# Patient Record
Sex: Female | Born: 1945 | Hispanic: Yes | Smoking: Former smoker
Health system: Southern US, Community
[De-identification: ages and names within clinical notes are randomized; demographics above are authoritative.]

## PROBLEM LIST (undated history)

## (undated) DIAGNOSIS — E049 Nontoxic goiter, unspecified: Secondary | ICD-10-CM

## (undated) DIAGNOSIS — J40 Bronchitis, not specified as acute or chronic: Secondary | ICD-10-CM

## (undated) DIAGNOSIS — I499 Cardiac arrhythmia, unspecified: Secondary | ICD-10-CM

## (undated) DIAGNOSIS — N6019 Diffuse cystic mastopathy of unspecified breast: Secondary | ICD-10-CM

## (undated) DIAGNOSIS — I10 Essential (primary) hypertension: Secondary | ICD-10-CM

## (undated) DIAGNOSIS — E785 Hyperlipidemia, unspecified: Secondary | ICD-10-CM

## (undated) DIAGNOSIS — E559 Vitamin D deficiency, unspecified: Secondary | ICD-10-CM

## (undated) DIAGNOSIS — E039 Hypothyroidism, unspecified: Secondary | ICD-10-CM

## (undated) DIAGNOSIS — L509 Urticaria, unspecified: Secondary | ICD-10-CM

## (undated) HISTORY — PX: THYROIDECTOMY: SHX17

## (undated) HISTORY — PX: FRACTURE SURGERY: SHX138

## (undated) HISTORY — PX: APPENDECTOMY: SHX54

## (undated) HISTORY — PX: ABDOMINAL HYSTERECTOMY: SHX81

## (undated) HISTORY — PX: COLONOSCOPY: SHX174

## (undated) HISTORY — PX: BREAST CYST ASPIRATION: SHX578

---

## 2004-01-31 ENCOUNTER — Ambulatory Visit: Payer: Self-pay | Admitting: Unknown Physician Specialty

## 2004-02-06 ENCOUNTER — Ambulatory Visit: Payer: Self-pay

## 2005-02-28 ENCOUNTER — Ambulatory Visit: Payer: Self-pay | Admitting: Unknown Physician Specialty

## 2006-04-16 ENCOUNTER — Ambulatory Visit: Payer: Self-pay | Admitting: Unknown Physician Specialty

## 2007-04-30 ENCOUNTER — Ambulatory Visit: Payer: Self-pay | Admitting: Unknown Physician Specialty

## 2008-05-03 ENCOUNTER — Ambulatory Visit: Payer: Self-pay | Admitting: Unknown Physician Specialty

## 2009-07-25 ENCOUNTER — Ambulatory Visit: Payer: Self-pay | Admitting: Unknown Physician Specialty

## 2010-05-29 ENCOUNTER — Ambulatory Visit: Payer: Self-pay | Admitting: Allergy and Immunology

## 2010-06-20 ENCOUNTER — Emergency Department: Payer: Self-pay | Admitting: Emergency Medicine

## 2010-07-04 ENCOUNTER — Ambulatory Visit: Payer: Self-pay | Admitting: Unknown Physician Specialty

## 2010-07-30 ENCOUNTER — Ambulatory Visit: Payer: Self-pay | Admitting: Unknown Physician Specialty

## 2011-11-12 ENCOUNTER — Ambulatory Visit: Payer: Self-pay | Admitting: Unknown Physician Specialty

## 2013-01-06 ENCOUNTER — Ambulatory Visit: Payer: Self-pay | Admitting: Internal Medicine

## 2014-01-31 ENCOUNTER — Other Ambulatory Visit: Payer: Self-pay | Admitting: Gastroenterology

## 2014-01-31 ENCOUNTER — Emergency Department: Payer: Self-pay | Admitting: Emergency Medicine

## 2014-01-31 LAB — COMPREHENSIVE METABOLIC PANEL
ALT: 26 U/L
AST: 21 U/L (ref 15–37)
Albumin: 3.5 g/dL (ref 3.4–5.0)
Alkaline Phosphatase: 77 U/L
Anion Gap: 6 — ABNORMAL LOW (ref 7–16)
BUN: 8 mg/dL (ref 7–18)
Bilirubin,Total: 0.4 mg/dL (ref 0.2–1.0)
Calcium, Total: 8.1 mg/dL — ABNORMAL LOW (ref 8.5–10.1)
Chloride: 113 mmol/L — ABNORMAL HIGH (ref 98–107)
Co2: 29 mmol/L (ref 21–32)
Creatinine: 0.81 mg/dL (ref 0.60–1.30)
EGFR (Non-African Amer.): >60
Glucose: 104 mg/dL — ABNORMAL HIGH (ref 65–99)
Osmolality: 293 (ref 275–301)
Potassium: 3.6 mmol/L (ref 3.5–5.1)
Sodium: 148 mmol/L — ABNORMAL HIGH (ref 136–145)
Total Protein: 7.3 g/dL (ref 6.4–8.2)

## 2014-01-31 LAB — CBC
HCT: 46.3 % (ref 35.0–47.0)
HGB: 15.2 g/dL (ref 12.0–16.0)
MCH: 30.5 pg (ref 26.0–34.0)
MCHC: 32.8 g/dL (ref 32.0–36.0)
MCV: 93 fL (ref 80–100)
PLATELETS: 241 10*3/uL (ref 150–440)
RBC: 4.99 10*6/uL (ref 3.80–5.20)
RDW: 12.2 % (ref 11.5–14.5)
WBC: 4.3 10*3/uL (ref 3.6–11.0)

## 2014-01-31 LAB — URINALYSIS, COMPLETE
BILIRUBIN, UR: NEGATIVE
Bacteria: NONE SEEN
Blood: NEGATIVE
GLUCOSE, UR: NEGATIVE mg/dL (ref 0–75)
KETONE: NEGATIVE
Leukocyte Esterase: NEGATIVE
Nitrite: NEGATIVE
PH: 7 (ref 4.5–8.0)
Protein: NEGATIVE
SQUAMOUS EPITHELIAL: NONE SEEN
Specific Gravity: 1.003 (ref 1.003–1.030)
WBC UR: <1 /HPF (ref 0–5)

## 2014-01-31 LAB — TROPONIN I

## 2014-02-07 ENCOUNTER — Ambulatory Visit: Payer: Self-pay | Admitting: Internal Medicine

## 2016-07-11 ENCOUNTER — Other Ambulatory Visit: Payer: Self-pay | Admitting: Internal Medicine

## 2016-07-11 DIAGNOSIS — Z1231 Encounter for screening mammogram for malignant neoplasm of breast: Secondary | ICD-10-CM

## 2016-08-20 ENCOUNTER — Encounter: Payer: Self-pay | Admitting: Radiology

## 2016-08-20 ENCOUNTER — Ambulatory Visit
Admission: RE | Admit: 2016-08-20 | Discharge: 2016-08-20 | Disposition: A | Discharge status: Home / Self Care | Payer: Medicare Other | Source: Ambulatory Visit | Attending: Internal Medicine | Admitting: Internal Medicine

## 2016-08-20 DIAGNOSIS — Z1231 Encounter for screening mammogram for malignant neoplasm of breast: Secondary | ICD-10-CM | POA: Insufficient documentation

## 2016-10-31 ENCOUNTER — Other Ambulatory Visit: Payer: Self-pay | Admitting: Internal Medicine

## 2016-10-31 DIAGNOSIS — R042 Hemoptysis: Secondary | ICD-10-CM

## 2016-11-05 ENCOUNTER — Ambulatory Visit
Admission: RE | Admit: 2016-11-05 | Discharge: 2016-11-05 | Disposition: A | Discharge status: Home / Self Care | Payer: Medicare Other | Source: Ambulatory Visit | Attending: Internal Medicine | Admitting: Internal Medicine

## 2016-11-05 DIAGNOSIS — R042 Hemoptysis: Secondary | ICD-10-CM

## 2016-11-05 DIAGNOSIS — R05 Cough: Secondary | ICD-10-CM | POA: Diagnosis present

## 2016-11-05 DIAGNOSIS — I700 Atherosclerosis of aorta: Secondary | ICD-10-CM | POA: Insufficient documentation

## 2016-11-05 HISTORY — DX: Essential (primary) hypertension: I10

## 2016-11-05 MED ORDER — IOPAMIDOL (ISOVUE-300) INJECTION 61%
75.0000 mL | Freq: Once | INTRAVENOUS | Status: AC | PRN
Start: 2016-11-05 — End: 2016-11-05
  Administered 2016-11-05: 75 mL via INTRAVENOUS

## 2016-11-08 ENCOUNTER — Ambulatory Visit: Payer: Medicare Other

## 2016-12-19 ENCOUNTER — Other Ambulatory Visit: Payer: Self-pay | Admitting: Otolaryngology

## 2016-12-19 DIAGNOSIS — E041 Nontoxic single thyroid nodule: Secondary | ICD-10-CM

## 2016-12-24 ENCOUNTER — Ambulatory Visit
Admission: RE | Admit: 2016-12-24 | Discharge: 2016-12-24 | Disposition: A | Discharge status: Home / Self Care | Payer: Medicare Other | Source: Ambulatory Visit | Attending: Otolaryngology | Admitting: Otolaryngology

## 2016-12-24 DIAGNOSIS — E041 Nontoxic single thyroid nodule: Secondary | ICD-10-CM

## 2017-01-23 ENCOUNTER — Encounter: Payer: Self-pay | Admitting: *Deleted

## 2017-01-24 ENCOUNTER — Encounter
Admission: RE | Disposition: A | Discharge status: Home / Self Care | Payer: Self-pay | Source: Ambulatory Visit | Attending: Gastroenterology

## 2017-01-24 ENCOUNTER — Ambulatory Visit
Admission: RE | Admit: 2017-01-24 | Discharge: 2017-01-24 | Disposition: A | Discharge status: Home / Self Care | Payer: Medicare Other | Source: Ambulatory Visit | Attending: Gastroenterology | Admitting: Gastroenterology

## 2017-01-24 ENCOUNTER — Ambulatory Visit: Payer: Medicare Other | Admitting: Anesthesiology

## 2017-01-24 DIAGNOSIS — I10 Essential (primary) hypertension: Secondary | ICD-10-CM | POA: Insufficient documentation

## 2017-01-24 DIAGNOSIS — E559 Vitamin D deficiency, unspecified: Secondary | ICD-10-CM | POA: Insufficient documentation

## 2017-01-24 DIAGNOSIS — Q438 Other specified congenital malformations of intestine: Secondary | ICD-10-CM | POA: Diagnosis not present

## 2017-01-24 DIAGNOSIS — Z87891 Personal history of nicotine dependence: Secondary | ICD-10-CM | POA: Insufficient documentation

## 2017-01-24 DIAGNOSIS — Z7982 Long term (current) use of aspirin: Secondary | ICD-10-CM | POA: Insufficient documentation

## 2017-01-24 DIAGNOSIS — Z79899 Other long term (current) drug therapy: Secondary | ICD-10-CM | POA: Insufficient documentation

## 2017-01-24 DIAGNOSIS — Z1211 Encounter for screening for malignant neoplasm of colon: Secondary | ICD-10-CM | POA: Diagnosis present

## 2017-01-24 DIAGNOSIS — J449 Chronic obstructive pulmonary disease, unspecified: Secondary | ICD-10-CM | POA: Insufficient documentation

## 2017-01-24 DIAGNOSIS — E039 Hypothyroidism, unspecified: Secondary | ICD-10-CM | POA: Insufficient documentation

## 2017-01-24 DIAGNOSIS — K635 Polyp of colon: Secondary | ICD-10-CM | POA: Insufficient documentation

## 2017-01-24 HISTORY — DX: Urticaria, unspecified: L50.9

## 2017-01-24 HISTORY — DX: Bronchitis, not specified as acute or chronic: J40

## 2017-01-24 HISTORY — PX: COLONOSCOPY WITH PROPOFOL: SHX5780

## 2017-01-24 HISTORY — DX: Cardiac arrhythmia, unspecified: I49.9

## 2017-01-24 HISTORY — DX: Vitamin D deficiency, unspecified: E55.9

## 2017-01-24 HISTORY — DX: Hyperlipidemia, unspecified: E78.5

## 2017-01-24 HISTORY — DX: Hypothyroidism, unspecified: E03.9

## 2017-01-24 HISTORY — DX: Nontoxic goiter, unspecified: E04.9

## 2017-01-24 HISTORY — DX: Diffuse cystic mastopathy of unspecified breast: N60.19

## 2017-01-24 SURGERY — COLONOSCOPY WITH PROPOFOL
Anesthesia: General

## 2017-01-24 MED ORDER — SODIUM CHLORIDE 0.9 % IV SOLN
INTRAVENOUS | Status: DC
  Administered 2017-01-24: 08:00:00 via INTRAVENOUS

## 2017-01-24 MED ORDER — PROPOFOL 500 MG/50ML IV EMUL
INTRAVENOUS | Status: DC | PRN
  Administered 2017-01-24: 150 ug/kg/min via INTRAVENOUS

## 2017-01-24 MED ORDER — PHENYLEPHRINE HCL 10 MG/ML IJ SOLN
INTRAMUSCULAR | Status: DC | PRN
  Administered 2017-01-24 (×3): 100 ug via INTRAVENOUS

## 2017-01-24 MED ORDER — SODIUM CHLORIDE 0.9 % IV SOLN: INTRAVENOUS | Status: DC

## 2017-01-24 MED ORDER — PROPOFOL 10 MG/ML IV BOLUS
INTRAVENOUS | Status: DC | PRN
  Administered 2017-01-24: 20 mg via INTRAVENOUS
  Administered 2017-01-24: 50 mg via INTRAVENOUS

## 2017-01-24 NOTE — Anesthesia Post-op Follow-up Note (Signed)
Anesthesia QCDR form completed.        

## 2017-01-24 NOTE — H&P (Signed)
Outpatient short stay form Pre-procedure 01/24/2017 8:44 AM Christena DeemMartin U Ignacia Gentzler MD  Primary Physician: Dr. Leotis ShamesJasmine Singh  Reason for visit:  Screening colonoscopy  History of present illness:  Patient is a 71 year old female presenting today as above. Her last colonoscopy was in 2005 that was normal. She tolerated her prep well. She takes no aspirin with the exception of 81 mg tablet, or blood thinning agents.    Current Facility-Administered Medications:  .  0.9 %  sodium chloride infusion, , Intravenous, Continuous, Christena DeemSkulskie, Raetta Agostinelli U, MD, Last Rate: 20 mL/hr at 01/24/17 0801 .  0.9 %  sodium chloride infusion, , Intravenous, Continuous, Christena DeemSkulskie, Jesica Goheen U, MD  Medications Prior to Admission  Medication Sig Dispense Refill Last Dose  . aspirin EC 81 MG tablet Take 81 mg daily by mouth.     . Calcium Carbonate-Vitamin D (CALCIUM 600+D PO) Take 2 tablets daily by mouth.   01/23/2017 at Unknown time  . levothyroxine (SYNTHROID, LEVOTHROID) 75 MCG tablet Take 75 mcg daily before breakfast by mouth.   01/24/2017 at Unknown time  . losartan-hydrochlorothiazide (HYZAAR) 50-12.5 MG tablet Take 1 tablet daily by mouth.   01/23/2017 at Unknown time  . Vitamin D, Ergocalciferol, (DRISDOL) 50000 units CAPS capsule Take 50,000 Units every 7 (seven) days by mouth.   01/23/2017 at Unknown time  . clotrimazole-betamethasone (LOTRISONE) lotion Apply 2 (two) times daily topically.   Not Taking at Unknown time  . terbinafine (LAMISIL) 250 MG tablet Take 250 mg daily by mouth.   Not Taking at Unknown time     Allergies  Allergen Reactions  . Boniva [Ibandronic Acid]   . Crestor [Rosuvastatin]   . Evista [Raloxifene Hcl]   . Fosamax [Alendronate]   . Simvastatin   . Terbinafine And Related   . Pravastatin Rash     Past Medical History:  Diagnosis Date  . Bronchitis   . Dysrhythmia   . Fibrocystic breast disease   . Goiter   . Hyperlipidemia   . Hypertension   . Hypothyroidism   . Urticaria    . Vitamin D deficiency     Review of systems:      Physical Exam    Heart and lungs: Regular rate and rhythm without rub or gallop, lungs are bilaterally clear    HEENT: Normocephalic atraumatic eyes are anicteric    Other:     Pertinant exam for procedure: Soft nontender nondistended bowel sounds positive normoactive    Planned proceedures: Colonoscopy and indicated procedures. I have discussed the risks benefits and complications of procedures to include not limited to bleeding, infection, perforation and the risk of sedation and the patient wishes to proceed.    Christena DeemMartin U Makenleigh Crownover, MD Gastroenterology 01/24/2017  8:44 AM

## 2017-01-24 NOTE — Transfer of Care (Signed)
Immediate Anesthesia Transfer of Care Note  Patient: Melinda Hansen H Vasey  Procedure(s) Performed: COLONOSCOPY WITH PROPOFOL (N/A )  Patient Location: PACU  Anesthesia Type:General  Level of Consciousness: awake and sedated  Airway & Oxygen Therapy: Patient Spontanous Breathing and Patient connected to nasal cannula oxygen  Post-op Assessment: Report given to RN and Post -op Vital signs reviewed and stable  Post vital signs: Reviewed and stable  Last Vitals:  Vitals:   01/24/17 0804  BP: (!) 147/65  Pulse: 66  Resp: 18  Temp: (!) 35.9 C  SpO2: 100%    Last Pain:  Vitals:   01/24/17 0804  TempSrc: Tympanic         Complications: No apparent anesthesia complications

## 2017-01-24 NOTE — Anesthesia Procedure Notes (Signed)
Date/Time: 01/24/2017 9:00 AM Performed by: Junious SilkNoles, Mariely Mahr, CRNA Pre-anesthesia Checklist: Patient identified, Emergency Drugs available, Suction available, Patient being monitored and Timeout performed Oxygen Delivery Method: Nasal cannula

## 2017-01-24 NOTE — Anesthesia Preprocedure Evaluation (Signed)
Anesthesia Evaluation  Patient identified by MRN, date of birth, ID band Patient awake    Reviewed: Allergy & Precautions, H&P , NPO status , Patient's Chart, lab work & pertinent test results, reviewed documented beta blocker date and time   History of Anesthesia Complications Negative for: history of anesthetic complications  Airway Mallampati: II  TM Distance: >3 FB Neck ROM: full    Dental  (+) Dental Advidsory Given, Teeth Intact   Pulmonary neg shortness of breath, COPD, Recent URI , Residual Cough, former smoker,           Cardiovascular Exercise Tolerance: Good hypertension, (-) angina(-) CAD, (-) Past MI, (-) Cardiac Stents and (-) CABG + dysrhythmias (-) Valvular Problems/Murmurs     Neuro/Psych negative neurological ROS  negative psych ROS   GI/Hepatic Neg liver ROS, GERD  ,  Endo/Other  diabetes (Pre-diabetic)Hypothyroidism   Renal/GU negative Renal ROS  negative genitourinary   Musculoskeletal   Abdominal   Peds  Hematology negative hematology ROS (+)   Anesthesia Other Findings Past Medical History: No date: Bronchitis No date: Dysrhythmia No date: Fibrocystic breast disease No date: Goiter No date: Hyperlipidemia No date: Hypertension No date: Hypothyroidism No date: Urticaria No date: Vitamin D deficiency   Reproductive/Obstetrics negative OB ROS                             Anesthesia Physical Anesthesia Plan  ASA: III  Anesthesia Plan: General   Post-op Pain Management:    Induction: Intravenous  PONV Risk Score and Plan: 3 and Propofol infusion  Airway Management Planned: Nasal Cannula  Additional Equipment:   Intra-op Plan:   Post-operative Plan:   Informed Consent: I have reviewed the patients History and Physical, chart, labs and discussed the procedure including the risks, benefits and alternatives for the proposed anesthesia with the patient or  authorized representative who has indicated his/her understanding and acceptance.   Dental Advisory Given  Plan Discussed with: Anesthesiologist, CRNA and Surgeon  Anesthesia Plan Comments:         Anesthesia Quick Evaluation

## 2017-01-24 NOTE — Op Note (Addendum)
[facility] Gastroenterology Patient Name: Melinda RossettiDelma Hansen Procedure Date: 01/24/2017 8:38 AM MRN: [id number] Account #: [id number] Date of Birth: 05/03/1945 Admit Type: Outpatient Age: [age] [facility] Gender: Female Note Status: Finalized Procedure:            Colonoscopy Indications:          Screening for colorectal malignant neoplasm Providers:            Christena DeemMartin U. Thelton Graca, MD Referring MD:         Leotis ShamesJasmine Singh (Referring MD) Medicines:            Monitored Anesthesia Care Complications:        No immediate complications. Variability of heart rate                        and rythm including rate controlled a-fib and                        intermittant long pause were noted. Procedure:            Pre-Anesthesia Assessment:                       - ASA Grade Assessment: III - A patient with severe                        systemic disease.                       After obtaining informed consent, the colonoscope was                        passed under direct vision. Throughout the procedure,                        the patient's blood pressure, pulse, and oxygen                        saturations were monitored continuously. The                        Colonoscope was introduced through the anus and                        advanced to the the cecum, identified by appendiceal                        orifice and ileocecal valve. The colonoscopy was                        unusually difficult due to a tortuous colon. Successful                        completion of the procedure was aided by changing the                        patient to a supine position, changing the patient to a                        prone position and using manual pressure. The patient  tolerated the procedure well. The quality of the bowel                        preparation was good. Findings:      A 2 mm polyp was found in the proximal descending colon. The polyp was        sessile. The polyp was removed with a cold biopsy forceps. Resection and       retrieval were complete.      The descending colon and transverse colon were significantly redundant.      The retroflexed view of the distal rectum and anal verge was normal and       showed no anal or rectal abnormalities.      The digital rectal exam was normal. Impression:           - One 2 mm polyp in the proximal descending colon,                        removed with a cold biopsy forceps. Resected and                        retrieved.                       - Redundant colon.                       - The distal rectum and anal verge are normal on                        retroflexion view. Recommendation:       - Discharge patient to home.                       - Telephone GI clinic for pathology results in 1 week. Procedure Code(s):    --- Professional ---                       [phone], Colonoscopy, flexible; with biopsy, single or                        multiple Diagnosis Code(s):    --- Professional ---                       Z12.11, Encounter for screening for malignant neoplasm                        of colon                       D12.4, Benign neoplasm of descending colon                       Q43.8, Other specified congenital malformations of                        intestine CPT copyright 2016 American Medical Association. All rights reserved. The codes documented in this report are preliminary and upon coder review may  be revised to meet current compliance requirements. Christena Deem, MD 01/24/2017 9:32:15 AM This report has been signed electronically. Number of Addenda: 0 Note Initiated On: 01/24/2017 8:38 AM Scope Withdrawal Time: 0  hours 8 minutes 50 seconds  Total Procedure Duration: 0 hours 32 minutes 57 seconds       [facility]

## 2017-01-24 NOTE — Anesthesia Postprocedure Evaluation (Signed)
Anesthesia Post Note  Patient: Corinna GabDelma H Bosher  Procedure(s) Performed: COLONOSCOPY WITH PROPOFOL (N/A )  Patient location during evaluation: Endoscopy Anesthesia Type: General Level of consciousness: awake and alert Pain management: pain level controlled Vital Signs Assessment: post-procedure vital signs reviewed and stable Respiratory status: spontaneous breathing, nonlabored ventilation, respiratory function stable and patient connected to nasal cannula oxygen Cardiovascular status: blood pressure returned to baseline and stable Postop Assessment: no apparent nausea or vomiting Anesthetic complications: no     Last Vitals:  Vitals:   01/24/17 0950 01/24/17 1000  BP: (!) 144/76 139/63  Pulse: (!) 57 (!) 58  Resp: 15 20  Temp:    SpO2: 100% 100%    Last Pain:  Vitals:   01/24/17 0930  TempSrc: Tympanic                 Lenard SimmerAndrew Caillou Minus

## 2017-01-27 ENCOUNTER — Encounter: Payer: Self-pay | Admitting: Gastroenterology

## 2017-01-27 LAB — SURGICAL PATHOLOGY

## 2017-07-07 ENCOUNTER — Other Ambulatory Visit: Payer: Self-pay | Admitting: Internal Medicine

## 2017-07-07 DIAGNOSIS — Z1239 Encounter for other screening for malignant neoplasm of breast: Secondary | ICD-10-CM

## 2017-09-02 ENCOUNTER — Ambulatory Visit
Admission: RE | Admit: 2017-09-02 | Discharge: 2017-09-02 | Disposition: A | Discharge status: Home / Self Care | Payer: Medicare Other | Source: Ambulatory Visit | Attending: Internal Medicine | Admitting: Internal Medicine

## 2017-09-02 DIAGNOSIS — Z1231 Encounter for screening mammogram for malignant neoplasm of breast: Secondary | ICD-10-CM | POA: Insufficient documentation

## 2017-09-02 DIAGNOSIS — Z1239 Encounter for other screening for malignant neoplasm of breast: Secondary | ICD-10-CM

## 2017-09-03 ENCOUNTER — Other Ambulatory Visit: Payer: Self-pay | Admitting: Internal Medicine

## 2017-09-03 DIAGNOSIS — R921 Mammographic calcification found on diagnostic imaging of breast: Secondary | ICD-10-CM

## 2017-09-03 DIAGNOSIS — R928 Other abnormal and inconclusive findings on diagnostic imaging of breast: Secondary | ICD-10-CM

## 2017-09-12 ENCOUNTER — Ambulatory Visit
Admission: RE | Admit: 2017-09-12 | Discharge: 2017-09-12 | Disposition: A | Discharge status: Home / Self Care | Payer: Medicare Other | Source: Ambulatory Visit | Attending: Internal Medicine | Admitting: Internal Medicine

## 2017-09-12 DIAGNOSIS — R928 Other abnormal and inconclusive findings on diagnostic imaging of breast: Secondary | ICD-10-CM | POA: Insufficient documentation

## 2017-09-12 DIAGNOSIS — R921 Mammographic calcification found on diagnostic imaging of breast: Secondary | ICD-10-CM | POA: Insufficient documentation

## 2018-02-11 ENCOUNTER — Other Ambulatory Visit: Payer: Self-pay | Admitting: Physician Assistant

## 2018-02-11 ENCOUNTER — Ambulatory Visit
Admission: RE | Admit: 2018-02-11 | Discharge: 2018-02-11 | Disposition: A | Discharge status: Home / Self Care | Payer: Medicare Other | Source: Ambulatory Visit | Attending: Physician Assistant | Admitting: Physician Assistant

## 2018-02-11 DIAGNOSIS — R1032 Left lower quadrant pain: Secondary | ICD-10-CM | POA: Diagnosis not present

## 2018-02-11 MED ORDER — IOPAMIDOL (ISOVUE-300) INJECTION 61%
100.0000 mL | Freq: Once | INTRAVENOUS | Status: AC | PRN
  Administered 2018-02-11: 100 mL via INTRAVENOUS

## 2018-03-09 ENCOUNTER — Ambulatory Visit: Payer: Self-pay | Admitting: Urology

## 2018-04-03 ENCOUNTER — Encounter: Payer: Self-pay | Admitting: Urology

## 2018-04-03 ENCOUNTER — Ambulatory Visit (INDEPENDENT_AMBULATORY_CARE_PROVIDER_SITE_OTHER): Payer: Medicare Other | Admitting: Urology

## 2018-04-03 ENCOUNTER — Encounter

## 2018-04-03 DIAGNOSIS — N1330 Unspecified hydronephrosis: Secondary | ICD-10-CM | POA: Diagnosis not present

## 2018-04-03 NOTE — Progress Notes (Signed)
04/03/2018 1:47 PM   Melinda Hansen 03/03/1946 [id number]  Referring provider: Leotis ShamesSingh, Jasmine, MD [address] [facility] [place], [place] [address]  Chief Complaint  Patient presents with  . Hydronephrosis    HPI: 73 year old female seen at request of Dr. Thedore MinsSingh for evaluation of right hydronephrosis.  She was seen in late October 2019 complaining of a left lower quadrant abdominal pain and increased storage related voiding symptoms.  Urinalysis grew a low level of staph and she was treated with antibiotics.  A CT of the abdomen pelvis was performed in November 2019 which showed moderate right hydronephrosis without ureteral dilation.  She denies right-sided flank or abdominal pain.  Her left abdominal pain has resolved.  She denies previous history of urologic problems.  She has no bothersome lower urinary tract symptoms and denies gross hematuria.   PMH: Past Medical History:  Diagnosis Date  . Bronchitis   . Dysrhythmia   . Fibrocystic breast disease   . Goiter   . Hyperlipidemia   . Hypertension   . Hypothyroidism   . Urticaria   . Vitamin D deficiency     Surgical History: Past Surgical History:  Procedure Laterality Date  . ABDOMINAL HYSTERECTOMY    . APPENDECTOMY    . COLONOSCOPY    . COLONOSCOPY WITH PROPOFOL N/A 01/24/2017   Procedure: COLONOSCOPY WITH PROPOFOL;  Surgeon: Christena DeemSkulskie, Martin U, MD;  [facility];  Service: Endoscopy;  Laterality: N/A;  . FRACTURE SURGERY     left ankle  . THYROIDECTOMY      Home Medications:  Allergies as of 04/03/2018      Reactions   Boniva [ibandronic Acid]    Crestor [rosuvastatin]    Evista [raloxifene Hcl]    Fosamax [alendronate]    Simvastatin    Terbinafine And Related    Pravastatin Rash      Medication List       Accurate as of April 03, 2018  1:47 PM. Always use your most recent med list.        aspirin EC 81 MG tablet Take 81 mg daily by mouth.   CALCIUM 600+D  PO Take 2 tablets daily by mouth.   hydrochlorothiazide 12.5 MG tablet Commonly known as:  HYDRODIURIL Take 12.5 mg by mouth daily.   levothyroxine 75 MCG tablet Commonly known as:  SYNTHROID, LEVOTHROID Take 75 mcg daily before breakfast by mouth.   losartan 50 MG tablet Commonly known as:  COZAAR Take 50 mg by mouth daily.   Vitamin D (Ergocalciferol) 1.25 MG (50000 UT) Caps capsule Commonly known as:  DRISDOL Take 50,000 Units every 7 (seven) days by mouth.       Allergies:  Allergies  Allergen Reactions  . Boniva [Ibandronic Acid]   . Crestor [Rosuvastatin]   . Evista [Raloxifene Hcl]   . Fosamax [Alendronate]   . Simvastatin   . Terbinafine And Related   . Pravastatin Rash    Family History: History reviewed. No pertinent family history.  Social History:  reports that she has quit smoking. She has never used smokeless tobacco. She reports that she does not drink alcohol or use drugs.  ROS: UROLOGY Frequent Urination?: No Hard to postpone urination?: Yes Burning/pain with urination?: No Get up at night to urinate?: Yes Leakage of urine?: No Urine stream starts and stops?: Yes Trouble starting stream?: No Do you have to strain to urinate?: No Blood in urine?: No Urinary tract infection?: No Sexually transmitted disease?: No Injury  to kidneys or bladder?: No Painful intercourse?: No Weak stream?: No Currently pregnant?: No Vaginal bleeding?: No Last menstrual period?: n  Gastrointestinal Nausea?: No Vomiting?: No Indigestion/heartburn?: No Diarrhea?: No Constipation?: No  Constitutional Fever: No Night sweats?: No Weight loss?: No Fatigue?: No  Skin Skin rash/lesions?: Yes Itching?: Yes  Eyes Blurred vision?: No Double vision?: No  Ears/Nose/Throat Sore throat?: No Sinus problems?: No  Hematologic/Lymphatic Swollen glands?: No Easy bruising?: No  Cardiovascular Leg swelling?: No Chest pain?: No  Respiratory Cough?:  No Shortness of breath?: No  Endocrine Excessive thirst?: No  Musculoskeletal Back pain?: No Joint pain?: No  Neurological Headaches?: No Dizziness?: No  Psychologic Depression?: No Anxiety?: No  Physical Exam: BP 124/76 (BP Location: Left Arm, Patient Position: Sitting, Cuff Size: Normal)   Pulse 66   Ht 5\' 3"  (1.6 m)   Wt 179 lb (81.2 kg)   BMI 31.71 kg/m   Constitutional:  Alert and oriented, No acute distress. HEENT: [place] AT, moist mucus membranes.  Trachea midline, no masses. Cardiovascular: No clubbing, cyanosis, or edema. Respiratory: Normal respiratory effort, no increased work of breathing. GI: Abdomen is soft, nontender, nondistended, no abdominal masses GU: No CVA tenderness Lymph: No cervical or inguinal lymphadenopathy. Skin: No rashes, bruises or suspicious lesions. Neurologic: Grossly intact, no focal deficits, moving all 4 extremities. Psychiatric: Normal mood and affect.    Pertinent Imaging: CT was reviewed.  There is mild parenchymal loss of the right kidney.  There is moderate right hydronephrosis without hydroureter.  Assessment & Plan:   73 year old female with moderate right hydronephrosis which is asymptomatic.  There are no prior studies for comparison to see if this is longstanding.  I have recommended a Lasix renogram to evaluate for function and obstruction.  She will be notified with results and further recommendations.   Riki AltesScott C Damica Gravlin, MD  [facility] [address], [address] [place], [place] [address] [phone]

## 2018-04-08 ENCOUNTER — Ambulatory Visit: Payer: Self-pay | Admitting: Urology

## 2018-04-16 ENCOUNTER — Ambulatory Visit
Admission: RE | Admit: 2018-04-16 | Discharge: 2018-04-16 | Disposition: A | Discharge status: Home / Self Care | Payer: Medicare Other | Source: Ambulatory Visit | Attending: Urology | Admitting: Urology

## 2018-04-16 DIAGNOSIS — N1330 Unspecified hydronephrosis: Secondary | ICD-10-CM | POA: Insufficient documentation

## 2018-04-16 MED ORDER — FUROSEMIDE 10 MG/ML IJ SOLN
40.0000 mg | Freq: Once | INTRAMUSCULAR | Status: AC
  Administered 2018-04-16: 40.6 mg via INTRAVENOUS
  Filled 2018-04-16: qty 4

## 2018-04-16 MED ORDER — TECHNETIUM TC 99M MERTIATIDE
4.9600 | Freq: Once | INTRAVENOUS | Status: AC | PRN
  Administered 2018-04-16: 4.96 via INTRAVENOUS

## 2018-04-26 ENCOUNTER — Other Ambulatory Visit: Payer: Self-pay | Admitting: Urology

## 2018-04-26 DIAGNOSIS — N1330 Unspecified hydronephrosis: Secondary | ICD-10-CM

## 2018-04-27 ENCOUNTER — Telehealth: Payer: Self-pay | Admitting: Family Medicine

## 2018-04-27 NOTE — Telephone Encounter (Signed)
Patient notified and voiced understanding. A 3 month appointment is scheduled.

## 2018-04-27 NOTE — Telephone Encounter (Signed)
-----   Message from Riki Altes, MD sent at 04/26/2018 12:48 PM EST ----- Lasix renogram showed no evidence of obstruction.  Recommend a CT urogram and follow-up visit in 3 months.  Order was entered.

## 2018-05-14 ENCOUNTER — Ambulatory Visit
Admission: RE | Admit: 2018-05-14 | Discharge: 2018-05-14 | Disposition: A | Discharge status: Home / Self Care | Payer: Medicare Other | Source: Ambulatory Visit | Attending: Urology | Admitting: Urology

## 2018-05-14 DIAGNOSIS — N1330 Unspecified hydronephrosis: Secondary | ICD-10-CM | POA: Insufficient documentation

## 2018-05-14 LAB — POCT I-STAT CREATININE: Creatinine, Ser: 0.8 mg/dL (ref 0.44–1.00)

## 2018-05-14 MED ORDER — IOHEXOL 300 MG/ML  SOLN
125.0000 mL | Freq: Once | INTRAMUSCULAR | Status: AC | PRN
  Administered 2018-05-14: 125 mL via INTRAVENOUS

## 2018-05-18 ENCOUNTER — Telehealth: Payer: Self-pay

## 2018-05-18 NOTE — Telephone Encounter (Signed)
-----   Message from Riki Altes, MD sent at 05/16/2018  1:56 PM EST ----- Her CT scan was not supposed to be performed until April however radiology apparently did it early.  The CT showed no significant blockage or evidence of stone or tumor.  She was incidentally noted to have a stone in her gallbladder.  Follow-up as scheduled

## 2018-05-18 NOTE — Telephone Encounter (Signed)
Called pt informed her of the information below. Pt gave verbal understanding.  

## 2018-05-20 ENCOUNTER — Other Ambulatory Visit: Payer: Self-pay | Admitting: Internal Medicine

## 2018-05-20 DIAGNOSIS — R921 Mammographic calcification found on diagnostic imaging of breast: Secondary | ICD-10-CM

## 2018-05-29 ENCOUNTER — Ambulatory Visit
Admission: RE | Admit: 2018-05-29 | Discharge: 2018-05-29 | Disposition: A | Discharge status: Home / Self Care | Payer: Medicare Other | Source: Ambulatory Visit | Attending: Internal Medicine | Admitting: Internal Medicine

## 2018-05-29 ENCOUNTER — Other Ambulatory Visit: Payer: Self-pay

## 2018-05-29 DIAGNOSIS — R921 Mammographic calcification found on diagnostic imaging of breast: Secondary | ICD-10-CM | POA: Diagnosis present

## 2018-07-29 ENCOUNTER — Other Ambulatory Visit: Payer: Self-pay

## 2018-07-29 ENCOUNTER — Ambulatory Visit: Payer: Self-pay | Admitting: Urology

## 2018-07-29 ENCOUNTER — Telehealth (INDEPENDENT_AMBULATORY_CARE_PROVIDER_SITE_OTHER): Payer: Medicare Other | Admitting: Urology

## 2018-07-29 DIAGNOSIS — N1330 Unspecified hydronephrosis: Secondary | ICD-10-CM

## 2018-07-29 NOTE — Progress Notes (Signed)
Virtual Visit via Telephone Note  I connected with Melinda Hansen on 07/29/18 at 10:00 AM EDT by telephone and verified that I am speaking with the correct person using two identifiers.  Location: Patient: Hospital Provider: Home office   I discussed the limitations, risks, security and privacy concerns of performing an evaluation and management service by telephone and the availability of in person appointments. I also discussed with the patient that there may be a patient responsible charge related to this service. The patient expressed understanding and agreed to proceed.   History of Present Illness: 73 year old female for a follow-up telephone visit due to COVID-19 pandemic.  She actually thought her appointment was in the office today and was on the hospital campus.  She was initially seen by me in January 2020 with a history of left lower quadrant abdominal pain and urinary frequency and urgency.  A CT of the abdomen pelvis in November 2019 showed moderate right hydronephrosis without ureteral dilation.  Her abdominal pain had resolved.  A Lasix renal scan showed dilation of the right collecting system but no evidence of obstruction.  Differential renal function was 51.5% on the left and 48.5% on the right.  She did have a follow-up CT in February 2020 which showed resolution of her right hydronephrosis.  There was was an extrarenal pelvis present.  She remains asymptomatic and has no bothersome lower urinary tract symptoms, recurrent UTI or abdominal pain.   Observations/Objective:   Assessment and Plan: Resolution of hydronephrosis on follow-up imaging and prior Lasix renal scan showed no evidence of obstruction  Follow Up Instructions: 1 year for recheck   I discussed the assessment and treatment plan with the patient. The patient was provided an opportunity to ask questions and all were answered. The patient agreed with the plan and demonstrated an understanding of the  instructions.   The patient was advised to call back or seek an in-person evaluation if the symptoms worsen or if the condition fails to improve as anticipated.  I provided 5 minutes of non-face-to-face time during this encounter.   Riki Altes, MD

## 2019-02-15 ENCOUNTER — Other Ambulatory Visit: Payer: Self-pay | Admitting: Physician Assistant

## 2019-02-15 DIAGNOSIS — I83893 Varicose veins of bilateral lower extremities with other complications: Secondary | ICD-10-CM

## 2019-02-15 DIAGNOSIS — R600 Localized edema: Secondary | ICD-10-CM

## 2019-02-15 DIAGNOSIS — R252 Cramp and spasm: Secondary | ICD-10-CM

## 2019-02-19 ENCOUNTER — Ambulatory Visit: Payer: Medicare Other

## 2019-02-22 ENCOUNTER — Ambulatory Visit
Admission: RE | Admit: 2019-02-22 | Discharge: 2019-02-22 | Disposition: A | Discharge status: Home / Self Care | Payer: Medicare Other | Source: Ambulatory Visit | Attending: Physician Assistant | Admitting: Physician Assistant

## 2019-02-22 ENCOUNTER — Other Ambulatory Visit: Payer: Self-pay

## 2019-02-22 DIAGNOSIS — R252 Cramp and spasm: Secondary | ICD-10-CM

## 2019-02-22 DIAGNOSIS — I83893 Varicose veins of bilateral lower extremities with other complications: Secondary | ICD-10-CM | POA: Diagnosis present

## 2019-02-22 DIAGNOSIS — R600 Localized edema: Secondary | ICD-10-CM

## 2019-05-04 ENCOUNTER — Telehealth: Payer: Self-pay

## 2019-05-04 NOTE — Telephone Encounter (Signed)
Patient called to let us know that she was started on Eliquis by her cardiologist and wanted this noted in her chart, added to med list

## 2019-05-12 ENCOUNTER — Other Ambulatory Visit: Payer: Self-pay | Admitting: Physician Assistant

## 2019-07-29 ENCOUNTER — Ambulatory Visit: Payer: Medicare Other | Admitting: Urology

## 2019-08-06 ENCOUNTER — Encounter: Payer: Self-pay | Admitting: Urology

## 2019-08-06 ENCOUNTER — Ambulatory Visit (INDEPENDENT_AMBULATORY_CARE_PROVIDER_SITE_OTHER): Payer: Medicare Other | Admitting: Urology

## 2019-08-06 ENCOUNTER — Other Ambulatory Visit: Payer: Self-pay

## 2019-08-06 VITALS — BP 109/70 | HR 99 | Ht 63.0 in | Wt 167.0 lb

## 2019-08-06 DIAGNOSIS — Z87448 Personal history of other diseases of urinary system: Secondary | ICD-10-CM | POA: Diagnosis not present

## 2019-08-06 NOTE — Progress Notes (Signed)
08/06/2019 1:34 PM   Melinda Hansen September 12, 1945 [id number]  Referring provider: Leotis Shames, MD [address] [place],  [place] [address]  Chief Complaint  Patient presents with  . Hydronephrosis    Urologic history: 1.  Right hydronephrosis -CT 01/2018 performed for LLQ abdominal pain showed moderate right hydronephrosis -Lasix renal scan showed no evidence of obstruction with differential renal function 51.5 left/48.5 right -Follow-up CT 04/2018 showed resolution of right hydronephrosis  HPI: 74 y.o. female presents for follow-up of the above history.  Since her visit last year she has been doing well.  Denies flank, abdominal or pelvic pain.  Denies bothersome lower urinary tract symptoms, dysuria or gross hematuria.   PMH: Past Medical History:  Diagnosis Date  . Bronchitis   . Dysrhythmia   . Fibrocystic breast disease   . Goiter   . Hyperlipidemia   . Hypertension   . Hypothyroidism   . Urticaria   . Vitamin D deficiency     Surgical History: Past Surgical History:  Procedure Laterality Date  . ABDOMINAL HYSTERECTOMY    . APPENDECTOMY    . COLONOSCOPY    . COLONOSCOPY WITH PROPOFOL N/A 01/24/2017   Procedure: COLONOSCOPY WITH PROPOFOL;  Surgeon: Christena Deem, MD;  Location: [facility] ENDOSCOPY;  Service: Endoscopy;  Laterality: N/A;  . FRACTURE SURGERY     left ankle  . THYROIDECTOMY      Home Medications:  Allergies as of 08/06/2019      Reactions   Boniva [ibandronic Acid]    Crestor [rosuvastatin]    Evista [raloxifene Hcl]    Fosamax [alendronate]    Simvastatin    Terbinafine And Related    Pravastatin Rash      Medication List       Accurate as of Aug 06, 2019  1:34 PM. If you have any questions, ask your nurse or doctor.        apixaban 5 MG Tabs tablet Commonly known as: ELIQUIS Take by mouth.   aspirin EC 81 MG tablet Take 81 mg daily by mouth.   CALCIUM 600+D PO Take 2 tablets daily by  mouth.   hydrochlorothiazide 12.5 MG tablet Commonly known as: HYDRODIURIL Take 12.5 mg by mouth daily.   levothyroxine 75 MCG tablet Commonly known as: SYNTHROID Take 75 mcg daily before breakfast by mouth.   losartan 50 MG tablet Commonly known as: COZAAR Take 50 mg by mouth daily.   Vitamin D (Ergocalciferol) 1.25 MG (50000 UNIT) Caps capsule Commonly known as: DRISDOL Take 50,000 Units every 7 (seven) days by mouth.       Allergies:  Allergies  Allergen Reactions  . Boniva [Ibandronic Acid]   . Crestor [Rosuvastatin]   . Evista [Raloxifene Hcl]   . Fosamax [Alendronate]   . Simvastatin   . Terbinafine And Related   . Pravastatin Rash    Family History: History reviewed. No pertinent family history.  Social History:  reports that she has quit smoking. She has never used smokeless tobacco. She reports that she does not drink alcohol or use drugs.   Physical Exam: BP 109/70   Pulse 99   Ht 5\' 3"  (1.6 m)   Wt 167 lb (75.8 kg)   BMI 29.58 kg/m   Constitutional:  Alert and oriented, No acute distress. HEENT: [place] AT, moist mucus membranes.  Trachea midline, no masses. Cardiovascular: No clubbing, cyanosis, or edema. Respiratory: Normal respiratory effort, no increased work of breathing. Skin: No  rashes, bruises or suspicious lesions. Neurologic: Grossly intact, no focal deficits, moving all 4 extremities. Psychiatric: Normal mood and affect.   Assessment & Plan:   74 y.o. female with a history of obstructive right hydronephrosis that had resolved on a follow-up CT scan.  She remains asymptomatic.  She will follow-up as needed.  Melinda Hansen, [place] [address] [place], [place] [address] [phone]

## 2019-08-18 ENCOUNTER — Other Ambulatory Visit: Payer: Self-pay | Admitting: Physician Assistant

## 2019-08-18 DIAGNOSIS — Z1231 Encounter for screening mammogram for malignant neoplasm of breast: Secondary | ICD-10-CM

## 2019-09-01 ENCOUNTER — Ambulatory Visit
Admission: RE | Admit: 2019-09-01 | Discharge: 2019-09-01 | Disposition: A | Discharge status: Home / Self Care | Payer: Medicare Other | Source: Ambulatory Visit | Attending: Physician Assistant | Admitting: Physician Assistant

## 2019-09-01 DIAGNOSIS — Z1231 Encounter for screening mammogram for malignant neoplasm of breast: Secondary | ICD-10-CM | POA: Insufficient documentation

## 2020-02-26 IMAGING — CT CT ABD-PELV W/ CM
2 of 5 series · 16 of 46 positions shown, 18 images · IV contrast (iopamidol)
Comparison: None

CLINICAL DATA: Left quadrant pain since [REDACTED]

EXAM:
CT ABDOMEN AND PELVIS WITH CONTRAST
TECHNIQUE: Multidetector CT imaging of the abdomen and pelvis was performed
using the standard protocol following bolus administration of
intravenous contrast. Sagittal and coronal MPR images reconstructed
from axial data set.
CONTRAST:  100mL XEDSXJ-QKK IOPAMIDOL (XEDSXJ-QKK) INJECTION 61% IV.
Dilute oral contrast.

[Series 2: axial st · axial · 0.86mm/px · z∈[-482,-97]mm · 13 of 87 slices shown, 15 images]
[im 5/87  soft-tissue]
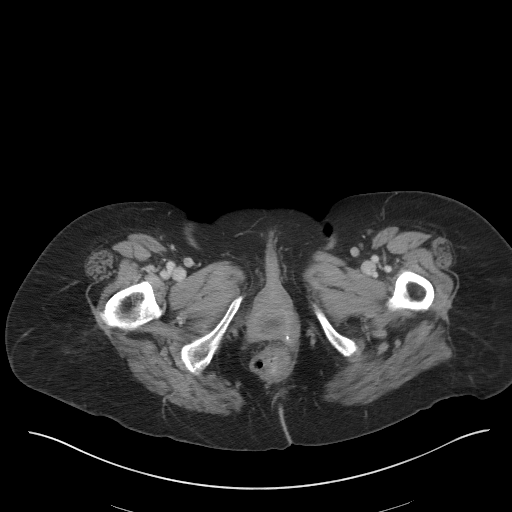
[im 5/87  bone]
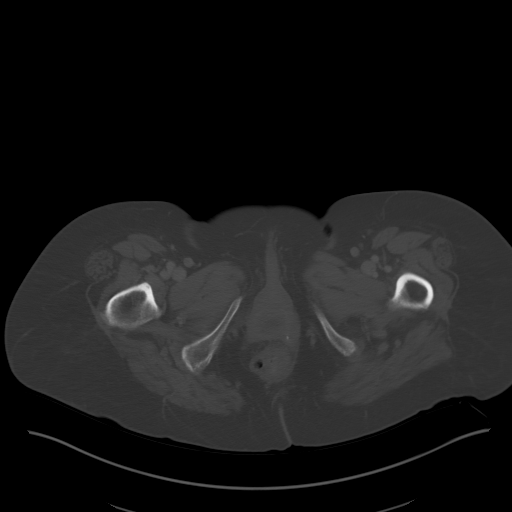
[im 14/87  soft-tissue]
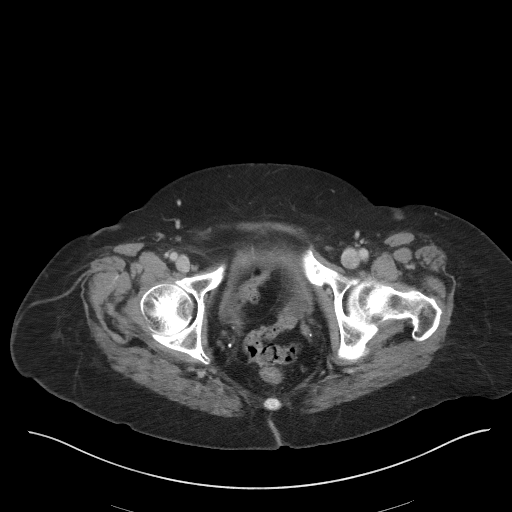
[im 19/87  soft-tissue]
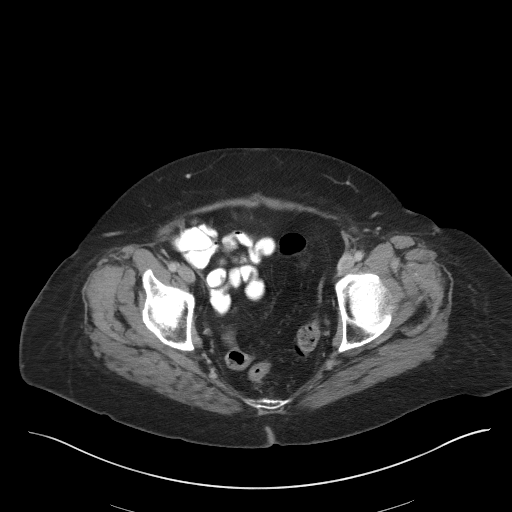
[im 23/87  soft-tissue]
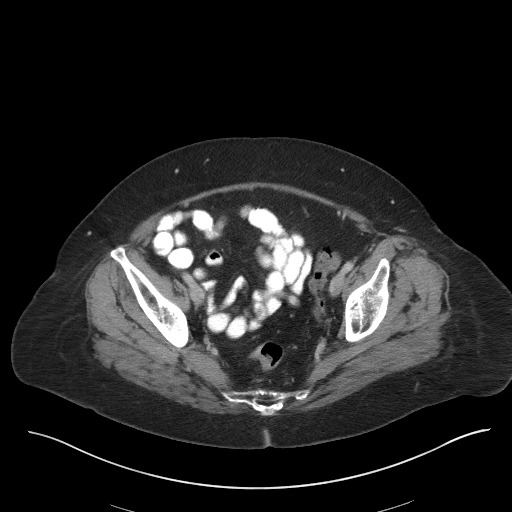
[im 32/87  soft-tissue]
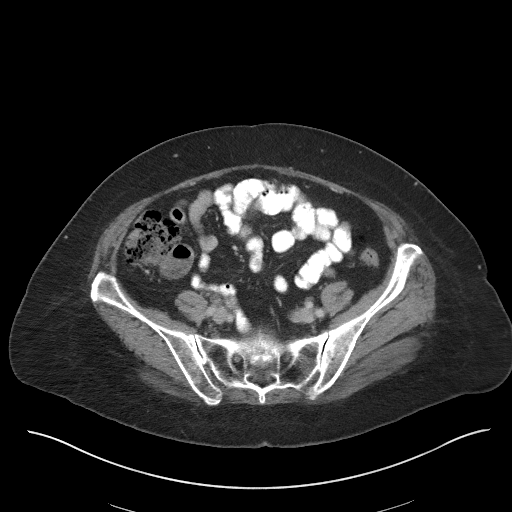
[im 37/87  soft-tissue]
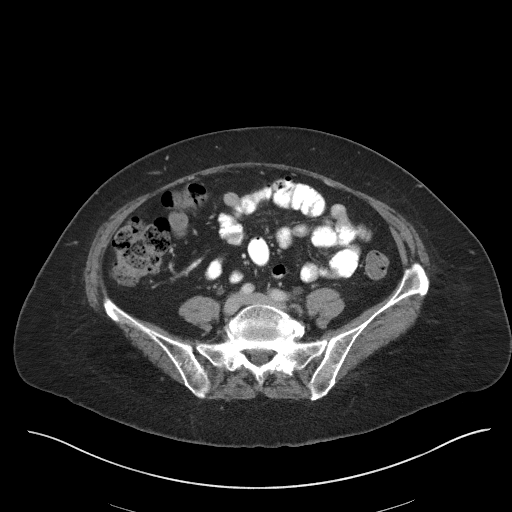
[im 46/87  soft-tissue]
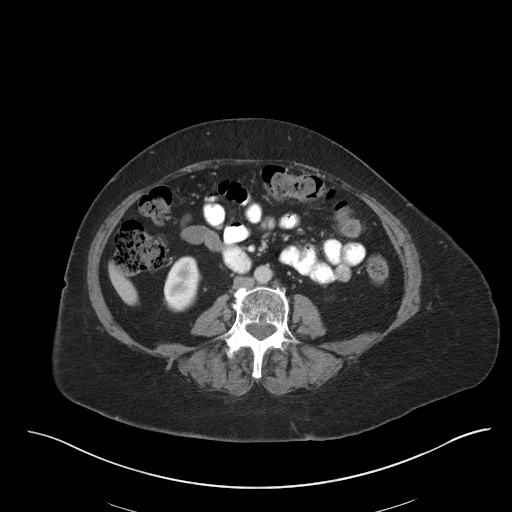
[im 50/87  soft-tissue]
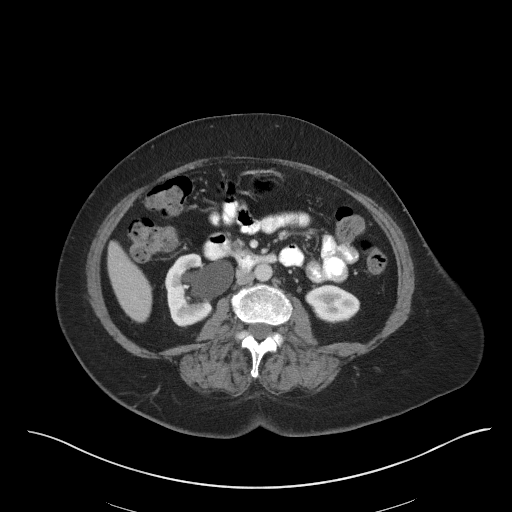
[im 55/87  soft-tissue]
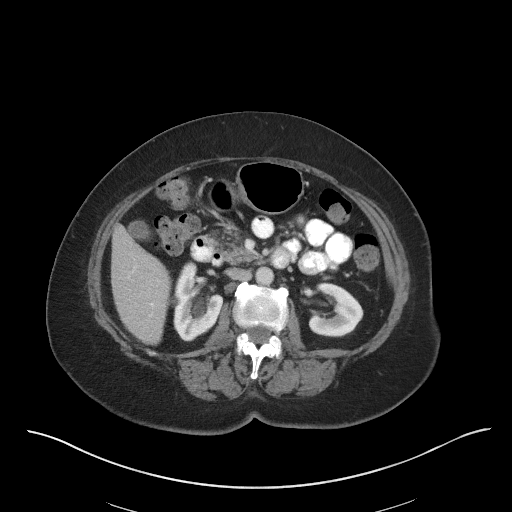
[im 55/87  bone]
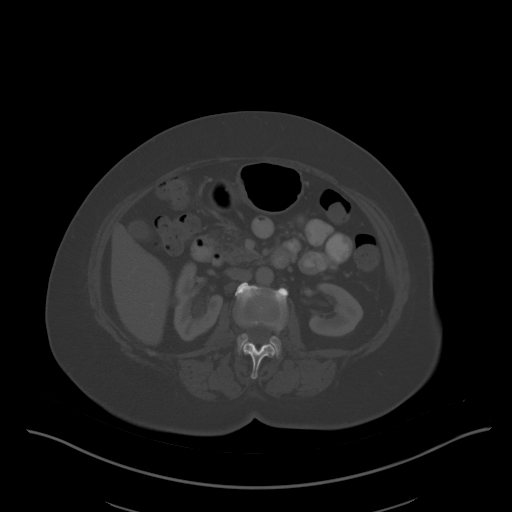
[im 64/87  soft-tissue]
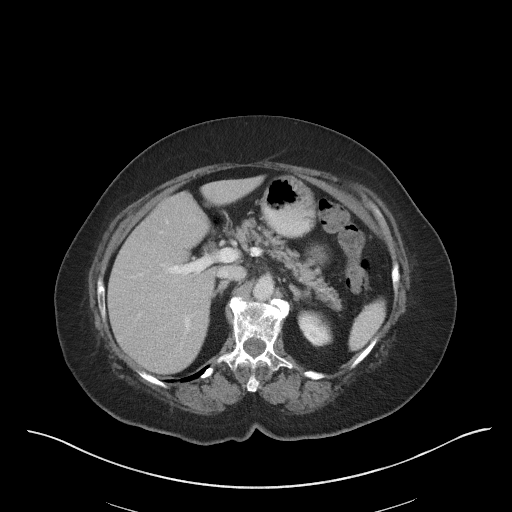
[im 68/87  soft-tissue]
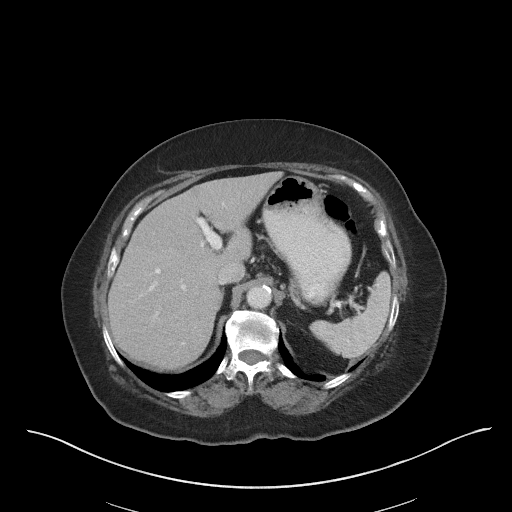
[im 73/87  soft-tissue]
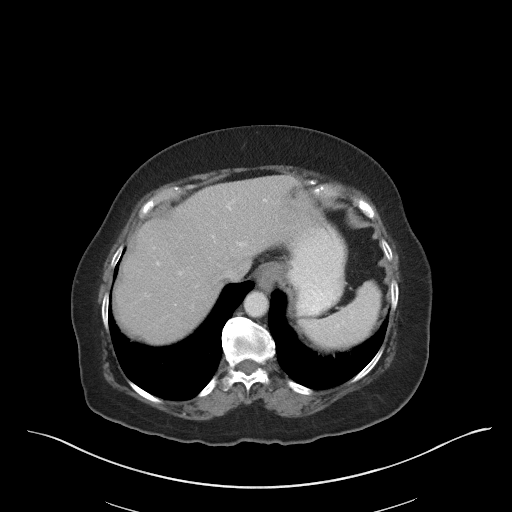
[im 82/87  soft-tissue]
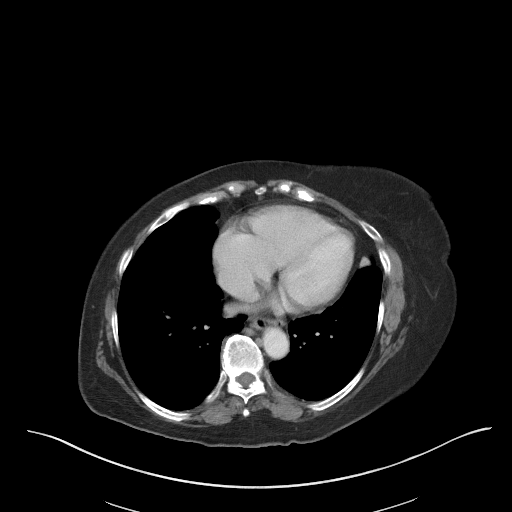

[Series 5: coronal st · coronal · 0.80mm/px · 3 of 91 slices shown]
[im 31/91  soft-tissue]
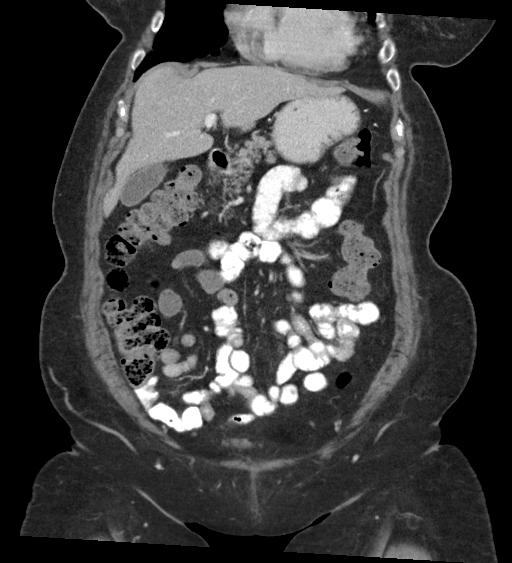
[im 41/91  soft-tissue]
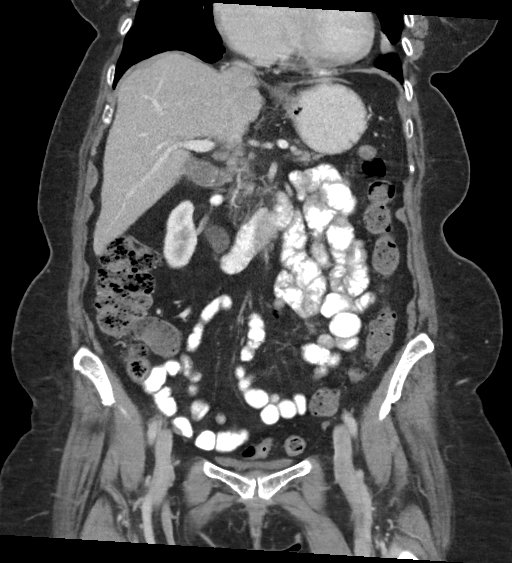
[im 51/91  soft-tissue]
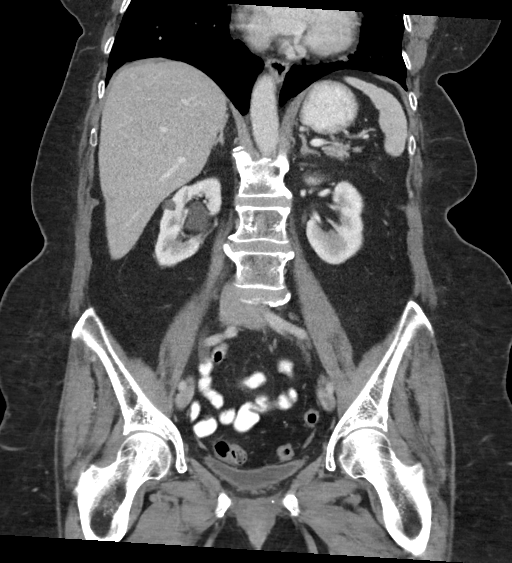

[16 of 46 positions shown; findings below may reference images not displayed]

FINDINGS: Lower chest: Minimal atelectasis or scarring LEFT lung base. No
basilar infiltrate.

Hepatobiliary: Cholelithiasis. Liver unremarkable. No biliary
dilatation.

Pancreas: Normal appearance

Spleen: Normal appearance

Adrenals/Urinary Tract: Adrenal glands normal appearance. Tiny RIGHT
renal cyst. Dilated RIGHT renal collecting system without ureteral
dilatation, question UPJ obstruction. No additional renal masses or
LEFT hydronephrosis. Ureters unremarkable. Low descent of urinary
bladder in pelvis consistent with cystocele.

Stomach/Bowel: Appendix surgically absent by history. Stomach and
bowel loops normal appearance

Vascular/Lymphatic: Few pelvic phleboliths. Aorta normal caliber.
Scattered atherosclerotic calcifications aorta. No adenopathy.

Reproductive: Uterus surgically absent. Nonvisualization of ovaries.

Other: No free air or free fluid. No hernia or acute inflammatory
process.

Musculoskeletal: Bones demineralized with mild scattered
degenerative disc disease changes of the thoracolumbar spine.
Bulging discs at L4-L5 and L5-S1.
IMPRESSION: Cholelithiasis.

Dilated RIGHT renal collecting system without ureteral dilatation
question UPJ obstruction.

Cystocele.

## 2020-05-28 IMAGING — CT CT ABD-PEL WO/W CM
3 of 12 series · 12 of 46 positions shown, 18 images · IV contrast (APPLIED)
Comparison: 02/11/2018

CLINICAL DATA: Prior hydronephrosis.  Pelvic pain.

EXAM:
CT ABDOMEN AND PELVIS WITHOUT AND WITH CONTRAST
TECHNIQUE: Multidetector CT imaging of the abdomen and pelvis was performed
following the standard protocol before and following the bolus
administration of intravenous contrast.
CONTRAST:  125mL OMNIPAQUE IOHEXOL 300 MG/ML  SOLN

[Series 2: axial pre · axial · non-contrast · 0.70mm/px · z∈[-402,-282]mm · 3 of 86 slices shown]
[im 13/86  soft-tissue]
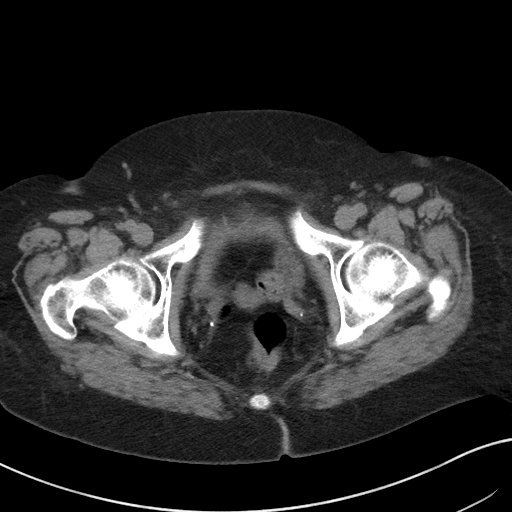
[im 25/86  soft-tissue]
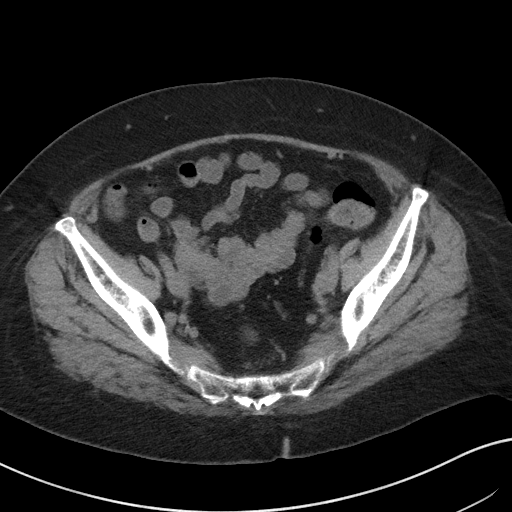
[im 37/86  soft-tissue]
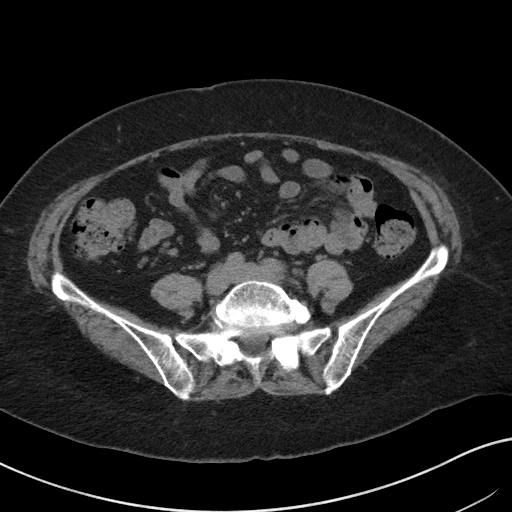

[Series 4: axial post · axial · 0.70mm/px · z∈[-414,-84]mm · 7 of 88 slices shown, 12 images]
[im 11/88  soft-tissue]
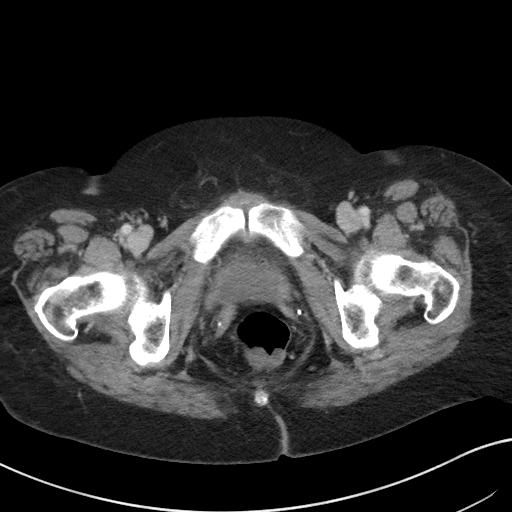
[im 11/88  bone]
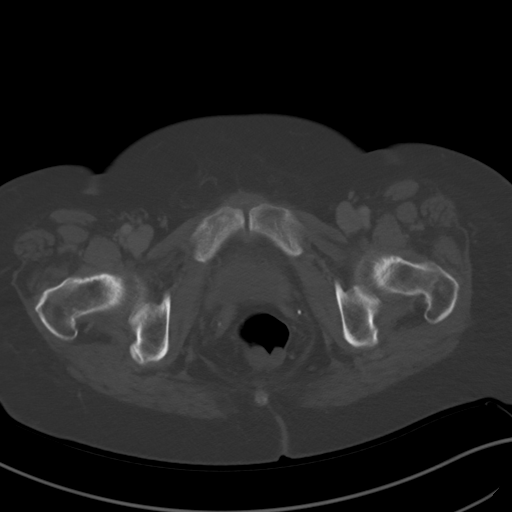
[im 22/88  soft-tissue]
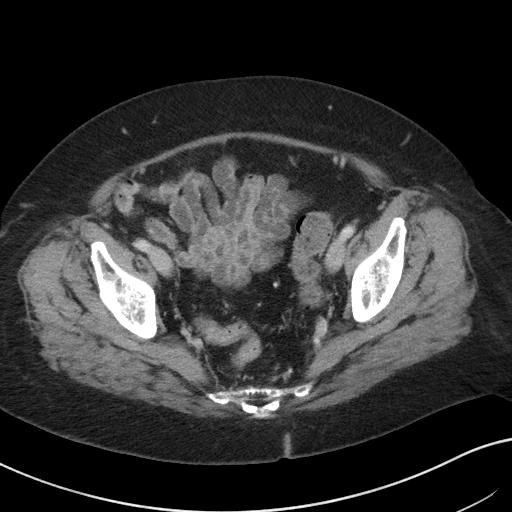
[im 33/88  soft-tissue]
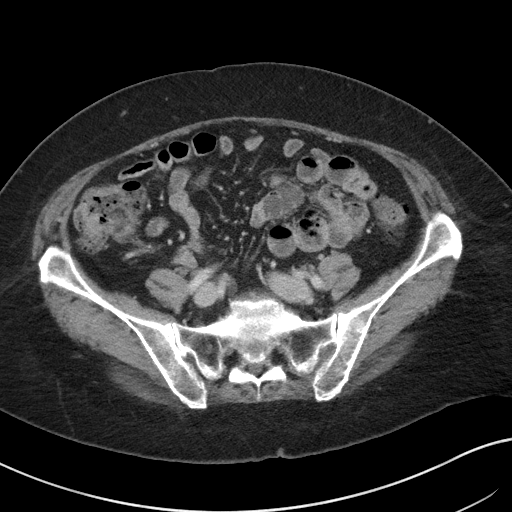
[im 44/88  soft-tissue]
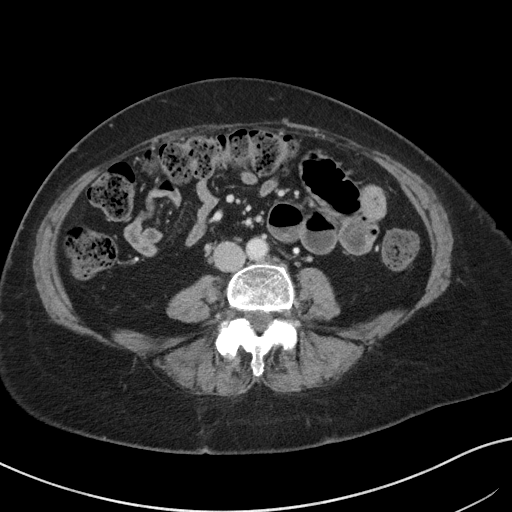
[im 44/88  lung]
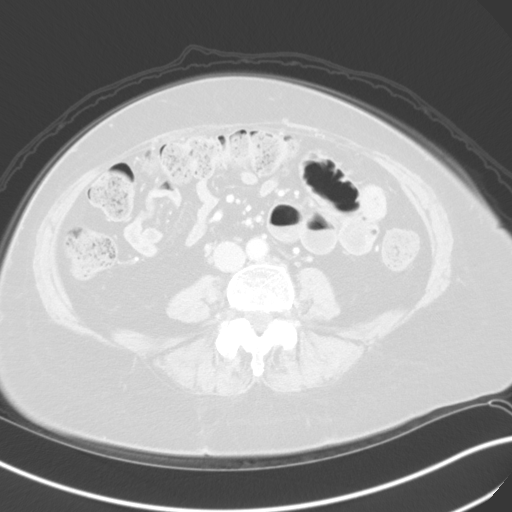
[im 55/88  soft-tissue]
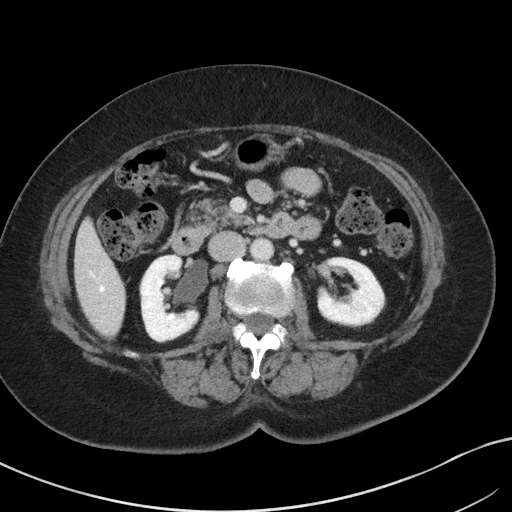
[im 55/88  lung]
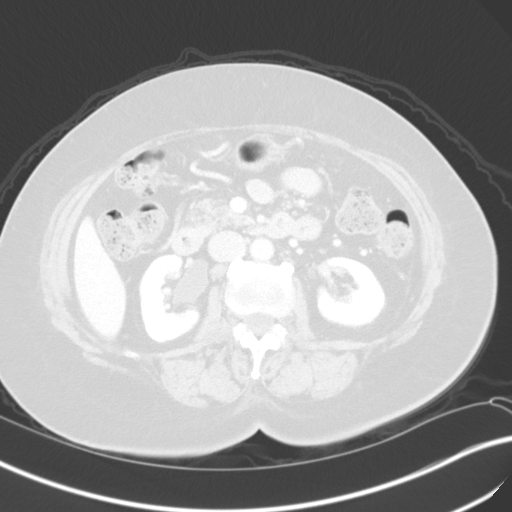
[im 66/88  soft-tissue]
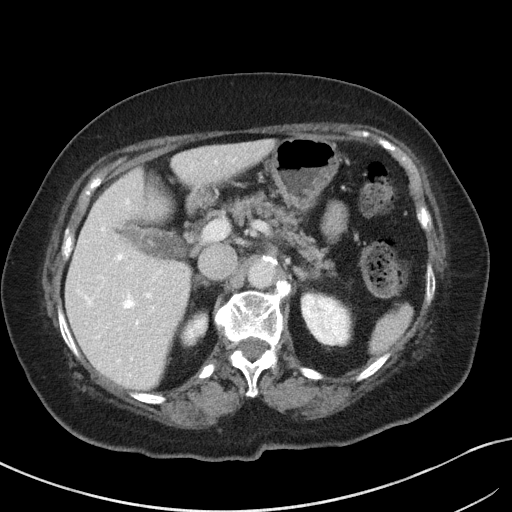
[im 66/88  lung]
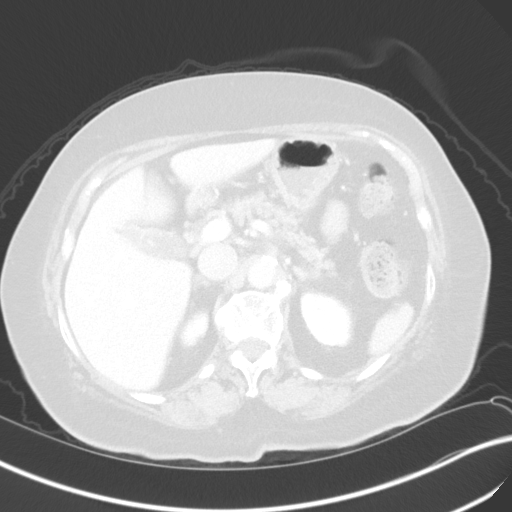
[im 77/88  soft-tissue]
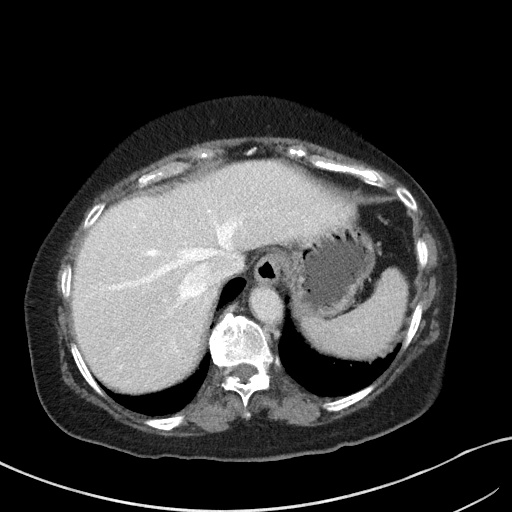
[im 77/88  lung]
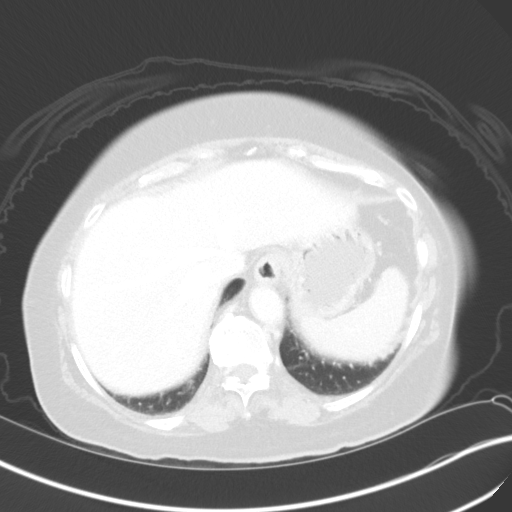

[Series 17: coronal delay · coronal · delayed · 0.68mm/px · 2 of 101 slices shown, 3 images]
[im 34/101  soft-tissue]
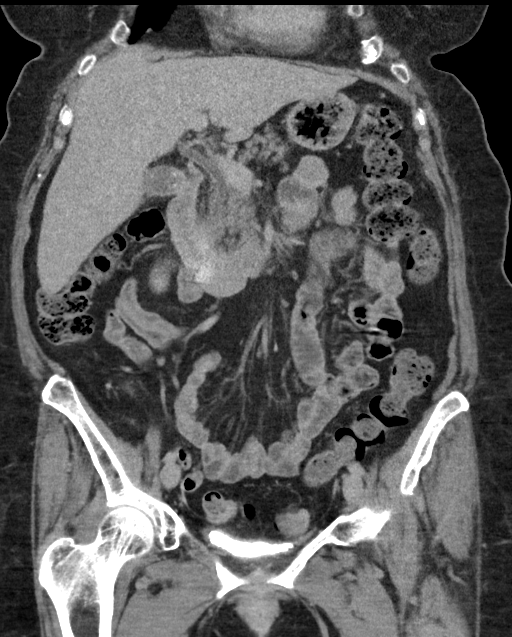
[im 34/101  bone]
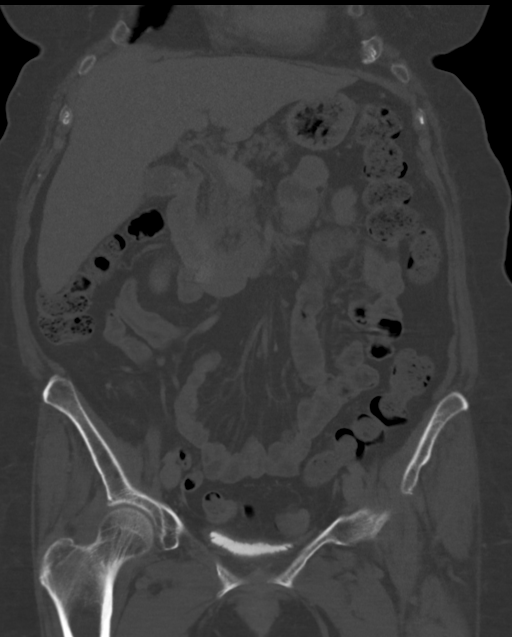
[im 67/101  soft-tissue]
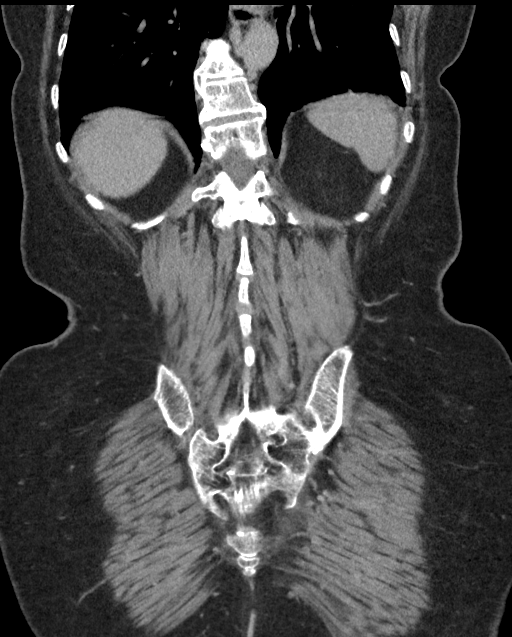

[12 of 46 positions shown; findings below may reference images not displayed]

FINDINGS: Lower chest: Mild cardiomegaly. Mild atelectasis or scarring in the
lingula.

Hepatobiliary: Gallbladder wall thickening. Suspected 3.7 cm in long
axis gallstone in the gallbladder. No biliary dilatation.

Pancreas: Unremarkable

Spleen: Unremarkable

Adrenals/Urinary Tract: Fullness of the right collecting system
without blunted calices or significant degree of infundibular
widening, probably from extrarenal pelvis. Abrupt transition between
the right renal pelvis and the ureter suggesting low-grade UPJ
narrowing.

1.1 by 0.9 by 0.9 cm hypodense lesion of the right kidney upper pole
is technically too small to characterize although statistically
likely to be benign. No appreciable filling defect along the
collecting systems.

Stomach/Bowel: Unremarkable

Vascular/Lymphatic: Aortoiliac atherosclerotic vascular disease. No
pathologic adenopathy identified.

Reproductive: Uterus absent.  Adnexa unremarkable.

Other: INSERT skip abdomen

Musculoskeletal: Low position of the anorectal junction suggesting
pelvic floor laxity. Grade 1 anterolisthesis trache that grade 1
degenerative anterolisthesis at L4-5.
IMPRESSION: 1. Although the prior hydronephrosis is no longer present, there
still some mild prominence of the right renal collecting system with
abrupt termination at the right UPJ suggesting nonobstructive right
UPJ narrowing.
2. No stones identified.
3. Small hypodense lesion of the right kidney upper pole is too
small to characterize although statistically likely to be benign.
4.  Aortic Atherosclerosis (TJILR-GTT.T).
5. Pelvic floor laxity.
6. Mild cardiomegaly.
7. 3.7 cm in long axis gallstone in the gallbladder, with
gallbladder wall thickening which could be from nondistention or
inflammation.

## 2020-08-22 ENCOUNTER — Other Ambulatory Visit: Payer: Self-pay | Admitting: Physician Assistant

## 2020-08-22 DIAGNOSIS — Z1231 Encounter for screening mammogram for malignant neoplasm of breast: Secondary | ICD-10-CM

## 2020-09-04 ENCOUNTER — Other Ambulatory Visit: Payer: Self-pay

## 2020-09-04 ENCOUNTER — Ambulatory Visit
Admission: RE | Admit: 2020-09-04 | Discharge: 2020-09-04 | Disposition: A | Discharge status: Home / Self Care | Payer: Medicare Other | Source: Ambulatory Visit | Attending: Physician Assistant | Admitting: Physician Assistant

## 2020-09-04 DIAGNOSIS — Z1231 Encounter for screening mammogram for malignant neoplasm of breast: Secondary | ICD-10-CM | POA: Insufficient documentation

## 2021-09-24 ENCOUNTER — Other Ambulatory Visit: Payer: Self-pay | Admitting: Physician Assistant

## 2021-09-24 DIAGNOSIS — Z1231 Encounter for screening mammogram for malignant neoplasm of breast: Secondary | ICD-10-CM

## 2021-11-08 ENCOUNTER — Ambulatory Visit
Admission: RE | Admit: 2021-11-08 | Discharge: 2021-11-08 | Disposition: A | Discharge status: Home / Self Care | Payer: Medicare Other | Source: Ambulatory Visit | Attending: Physician Assistant | Admitting: Physician Assistant

## 2021-11-08 DIAGNOSIS — Z1231 Encounter for screening mammogram for malignant neoplasm of breast: Secondary | ICD-10-CM | POA: Diagnosis present

## 2022-09-27 ENCOUNTER — Other Ambulatory Visit: Payer: Self-pay | Admitting: Physician Assistant

## 2022-09-27 DIAGNOSIS — Z1231 Encounter for screening mammogram for malignant neoplasm of breast: Secondary | ICD-10-CM

## 2022-11-11 ENCOUNTER — Ambulatory Visit
Admission: RE | Admit: 2022-11-11 | Discharge: 2022-11-11 | Disposition: A | Discharge status: Home / Self Care | Payer: Medicare Other | Source: Ambulatory Visit | Attending: Physician Assistant | Admitting: Physician Assistant

## 2022-11-11 DIAGNOSIS — Z1231 Encounter for screening mammogram for malignant neoplasm of breast: Secondary | ICD-10-CM | POA: Insufficient documentation

## 2023-10-22 ENCOUNTER — Other Ambulatory Visit: Payer: Self-pay | Admitting: Physician Assistant

## 2023-10-22 DIAGNOSIS — Z1231 Encounter for screening mammogram for malignant neoplasm of breast: Secondary | ICD-10-CM

## 2023-11-26 ENCOUNTER — Ambulatory Visit
Admission: RE | Admit: 2023-11-26 | Discharge: 2023-11-26 | Disposition: A | Discharge status: Home / Self Care | Source: Ambulatory Visit | Attending: Physician Assistant | Admitting: Physician Assistant

## 2023-11-26 ENCOUNTER — Ambulatory Visit

## 2023-11-26 DIAGNOSIS — Z1231 Encounter for screening mammogram for malignant neoplasm of breast: Secondary | ICD-10-CM | POA: Insufficient documentation

## 2024-01-15 ENCOUNTER — Ambulatory Visit: Admitting: Anesthesiology

## 2024-01-15 ENCOUNTER — Other Ambulatory Visit: Payer: Self-pay

## 2024-01-15 ENCOUNTER — Ambulatory Visit
Admission: RE | Admit: 2024-01-15 | Discharge: 2024-01-15 | Disposition: A | Discharge status: Home / Self Care | Attending: Gastroenterology | Admitting: Gastroenterology

## 2024-01-15 ENCOUNTER — Encounter: Payer: Self-pay | Admitting: Gastroenterology

## 2024-01-15 ENCOUNTER — Encounter
Admission: RE | Disposition: A | Discharge status: Home / Self Care | Payer: Self-pay | Source: Home / Self Care | Attending: Gastroenterology

## 2024-01-15 DIAGNOSIS — R1314 Dysphagia, pharyngoesophageal phase: Secondary | ICD-10-CM | POA: Insufficient documentation

## 2024-01-15 DIAGNOSIS — I10 Essential (primary) hypertension: Secondary | ICD-10-CM | POA: Insufficient documentation

## 2024-01-15 DIAGNOSIS — Z87891 Personal history of nicotine dependence: Secondary | ICD-10-CM | POA: Diagnosis not present

## 2024-01-15 DIAGNOSIS — Z7901 Long term (current) use of anticoagulants: Secondary | ICD-10-CM | POA: Insufficient documentation

## 2024-01-15 DIAGNOSIS — I4891 Unspecified atrial fibrillation: Secondary | ICD-10-CM | POA: Insufficient documentation

## 2024-01-15 HISTORY — PX: ESOPHAGOGASTRODUODENOSCOPY: SHX5428

## 2024-01-15 SURGERY — EGD (ESOPHAGOGASTRODUODENOSCOPY)
Anesthesia: General

## 2024-01-15 MED ORDER — PROPOFOL 500 MG/50ML IV EMUL
INTRAVENOUS | Status: DC | PRN
  Administered 2024-01-15: 145 ug/kg/min via INTRAVENOUS

## 2024-01-15 MED ORDER — PROPOFOL 10 MG/ML IV BOLUS
INTRAVENOUS | Status: DC | PRN
  Administered 2024-01-15: 50 mg via INTRAVENOUS

## 2024-01-15 MED ORDER — GLYCOPYRROLATE 0.2 MG/ML IJ SOLN
INTRAMUSCULAR | Status: DC | PRN
  Administered 2024-01-15: .2 mg via INTRAVENOUS

## 2024-01-15 MED ORDER — ESMOLOL HCL 100 MG/10ML IV SOLN
INTRAVENOUS | Status: DC | PRN
  Administered 2024-01-15 (×2): 10 mg via INTRAVENOUS

## 2024-01-15 MED ORDER — LIDOCAINE HCL (CARDIAC) PF 100 MG/5ML IV SOSY
PREFILLED_SYRINGE | INTRAVENOUS | Status: DC | PRN
  Administered 2024-01-15: 100 mg via INTRAVENOUS

## 2024-01-15 MED ORDER — SODIUM CHLORIDE 0.9 % IV SOLN: INTRAVENOUS | Status: DC

## 2024-01-15 NOTE — H&P (Signed)
 Melinda JONELLE Brooklyn, MD [facility], DHIP [address]  [place], [place] [address]  Main: [phone] Fax:  [phone] Pager: [phone]   Primary Care Physician:  Marikay Eva POUR, PA Primary Gastroenterologist:  Dr. Corinn JONELLE Hansen  Pre-Procedure History & Physical: HPI:  Melinda Hansen is a 78 y.o. female is here for an endoscopy.   Past Medical History:  Diagnosis Date   Bronchitis    Dysrhythmia    Fibrocystic breast disease    Goiter    Hyperlipidemia    Hypertension    Hypothyroidism    Urticaria    Vitamin D deficiency     Past Surgical History:  Procedure Laterality Date   ABDOMINAL HYSTERECTOMY     APPENDECTOMY     BREAST CYST ASPIRATION Left    COLONOSCOPY     COLONOSCOPY WITH PROPOFOL  N/A 01/24/2017   Procedure: COLONOSCOPY WITH PROPOFOL ;  Surgeon: Gaylyn Gladis PENNER, MD;  Location: [facility] ENDOSCOPY;  Service: Endoscopy;  Laterality: N/A;   FRACTURE SURGERY     left ankle   THYROIDECTOMY      Prior to Admission medications   Medication Sig Start Date End Date Taking? Authorizing Provider  Calcium Carbonate-Vitamin D (CALCIUM 600+D PO) Take 2 tablets daily by mouth.   Yes [provider]  levothyroxine (SYNTHROID, LEVOTHROID) 75 MCG tablet Take 75 mcg daily before breakfast by mouth.   Yes [provider]  losartan (COZAAR) 50 MG tablet Take 50 mg by mouth daily. 03/13/18  Yes [provider]  Vitamin D, Ergocalciferol, (DRISDOL) 50000 units CAPS capsule Take 50,000 Units every 7 (seven) days by mouth.   Yes [provider]  apixaban (ELIQUIS) 5 MG TABS tablet Take by mouth. 05/03/19   [provider]  aspirin EC 81 MG tablet Take 81 mg daily by mouth. Patient not taking: Reported on 01/15/2024    [provider]  hydrochlorothiazide (HYDRODIURIL) 12.5 MG tablet Take 12.5 mg by mouth daily. Patient not taking: Reported on 01/15/2024 03/13/18   [provider]     Allergies as of 01/02/2024 - Review Complete 08/06/2019  Allergen Reaction Noted   Boniva [ibandronate]  01/23/2017   Crestor [rosuvastatin]  01/23/2017   Evista [raloxifene hcl]  01/23/2017   Fosamax [alendronate]  01/23/2017   Simvastatin  01/23/2017   Terbinafine and related  01/23/2017   Pravastatin Rash 11/05/2016    Family History  Problem Relation Age of Onset   Breast cancer Other 29    Social History   Socioeconomic History   Marital status: Widowed    Spouse name: Not on file   Number of children: Not on file   Years of education: Not on file   Highest education level: Not on file  Occupational History   Not on file  Tobacco Use   Smoking status: Former   Smokeless tobacco: Never  Vaping Use   Vaping status: Never Used  Substance and Sexual Activity   Alcohol use: No   Drug use: No   Sexual activity: Not on file  Other Topics Concern   Not on file  Social History Narrative   Not on file   Social Drivers of Health   Financial Resource Strain: Low Risk  (01/01/2024)   Received from [facility]   Overall Financial Resource Strain (CARDIA)    Difficulty of Paying Living Expenses: Not hard at all  Food Insecurity: No Food Insecurity (01/01/2024)   Received from [facility]  Hunger Vital Sign    Within the past 12 months, you worried that your food would run out before you got the money to buy more.: Never true    Within the past 12 months, the food you bought just didn't last and you didn't have money to get more.: Never true  Transportation Needs: No Transportation Needs (01/01/2024)   Received from [facility] - Transportation    In the past 12 months, has lack of transportation kept you from medical appointments or from getting medications?: No    Lack of Transportation (Non-Medical): No  Physical Activity: Not on file  Stress: Not on file  Social Connections: Not on file   Intimate Partner Violence: Not on file    Review of Systems: See HPI, otherwise negative ROS  Physical Exam: BP 121/73   Pulse 62   Temp (!) 96.5 F (35.8 C) (Temporal)   Resp 16   Ht 5' 4 (1.626 m)   Wt 71.7 kg   SpO2 100%   BMI 27.12 kg/m  General:   Alert,  pleasant and cooperative in NAD Head:  Normocephalic and atraumatic. Neck:  Supple; no masses or thyromegaly. Lungs:  Clear throughout to auscultation.    Heart:  Regular rate and rhythm. Abdomen:  Soft, nontender and nondistended. Normal bowel sounds, without guarding, and without rebound.   Neurologic:  Alert and  oriented x4;  grossly normal neurologically.  Impression/Plan: Melinda Hansen is here for an endoscopy to be performed for dysphagia  Risks, benefits, limitations, and alternatives regarding  endoscopy have been reviewed with the patient.  Questions have been answered.  All parties agreeable.   Melinda Brooklyn, MD  01/15/2024, 8:18 AM

## 2024-01-15 NOTE — Op Note (Signed)
 [facility] Patient Name: Melinda Hansen Procedure Date: 01/15/2024 8:54 AM MRN: [id number] Account #: [id number] Date of Birth: [birth year] Admit Type: Outpatient Age: 78 Room: [facility] ENDO ROOM 1 Gender: Female Note Status: Finalized Instrument Name: Upper GI Scope [id number] Procedure:             Upper GI endoscopy Indications:           Esophageal dysphagia Providers:             Corinn Jess Brooklyn MD, MD Referring MD:          Miriam J. Mclaughlin, MD (Referring MD) Medicines:             General Anesthesia Complications:         No immediate complications. Estimated blood loss: None. Procedure:             Pre-Anesthesia Assessment:                        - Prior to the procedure, a History and Physical was                         performed, and patient medications and allergies were                         reviewed. The patient is competent. The risks and                         benefits of the procedure and the sedation options and                         risks were discussed with the patient. All questions                         were answered and informed consent was obtained.                         Patient identification and proposed procedure were                         verified by the physician, the nurse, the                         anesthesiologist, the anesthetist and the technician                         in the pre-procedure area in the procedure room in the                         endoscopy suite. Mental Status Examination: alert and                         oriented. Airway Examination: normal oropharyngeal                         airway and neck mobility. Respiratory Examination:                         clear to auscultation. CV Examination: normal.  Prophylactic Antibiotics: The patient does not require                         prophylactic antibiotics. Prior Anticoagulants: The                          patient has taken Eliquis (apixaban), last dose was 3                         days prior to procedure. ASA Grade Assessment: III - A                         patient with severe systemic disease. After reviewing                         the risks and benefits, the patient was deemed in                         satisfactory condition to undergo the procedure. The                         anesthesia plan was to use general anesthesia.                         Immediately prior to administration of medications,                         the patient was re-assessed for adequacy to receive                         sedatives. The heart rate, respiratory rate, oxygen                         saturations, blood pressure, adequacy of pulmonary                         ventilation, and response to care were monitored                         throughout the procedure. The physical status of the                         patient was re-assessed after the procedure.                        After obtaining informed consent, the endoscope was                         passed under direct vision. Throughout the procedure,                         the patient's blood pressure, pulse, and oxygen                         saturations were monitored continuously. The Endoscope                         was introduced through the mouth, and advanced to  the                         second part of duodenum. The upper GI endoscopy was                         accomplished without difficulty. The patient tolerated                         the procedure well. Findings:      The esophagus was normal.      The stomach was normal.      The examined duodenum was normal. Impression:            - Normal esophagus.                        - Normal stomach.                        - Normal examined duodenum.                        - No specimens collected. Recommendation:        - Discharge patient to home (with escort).                        -  Resume previous diet today.                        - Continue present medications.                        - Resume Eliquis (apixaban) today at prior dose. Refer                         to managing physician for further adjustment of                         therapy. Procedure Code(s):     --- Professional ---                        [phone], Esophagogastroduodenoscopy, flexible,                         transoral; diagnostic, including collection of                         specimen(s) by brushing or washing, when performed                         (separate procedure) Diagnosis Code(s):     --- Professional ---                        R13.14, Dysphagia, pharyngoesophageal phase CPT copyright 2022 American Medical Association. All rights reserved. The codes documented in this report are preliminary and upon coder review may  be revised to meet current compliance requirements. Dr. Corinn Brooklyn Corinn Jess Brooklyn MD, MD 01/15/2024 9:10:00 AM This report has been signed electronically. Number of Addenda: 0 Note Initiated On: 01/15/2024 8:54 AM Estimated Blood Loss:  Estimated blood loss: none.  [facility]

## 2024-01-15 NOTE — Transfer of Care (Signed)
 Immediate Anesthesia Transfer of Care Note  Patient: Melinda Hansen  Procedure(s) Performed: EGD (ESOPHAGOGASTRODUODENOSCOPY)  Patient Location: Endoscopy Unit  Anesthesia Type:General  Level of Consciousness: drowsy and patient cooperative  Airway & Oxygen Therapy: Patient Spontanous Breathing and Patient connected to face mask oxygen  Post-op Assessment: Report given to RN and Post -op Vital signs reviewed and stable  Post vital signs: Reviewed and stable  Last Vitals:  Vitals Value Taken Time  BP 107/80 01/15/24 09:06  Temp 35.6 C 01/15/24 09:06  Pulse 104 01/15/24 09:11  Resp 26 01/15/24 09:11  SpO2 97 % 01/15/24 09:11  Vitals shown include unfiled device data.  Last Pain:  Vitals:   01/15/24 0906  TempSrc: Temporal  PainSc: Asleep         Complications: No notable events documented.

## 2024-01-15 NOTE — Anesthesia Preprocedure Evaluation (Addendum)
 Anesthesia Evaluation  Patient identified by MRN, date of birth, ID band Patient awake    Reviewed: Allergy & Precautions, NPO status , Patient's Chart, lab work & pertinent test results  Airway Mallampati: II  TM Distance: >3 FB Neck ROM: Full    Dental  (+) Teeth Intact   Pulmonary Patient abstained from smoking., former smoker   Pulmonary exam normal breath sounds clear to auscultation       Cardiovascular Exercise Tolerance: Good hypertension, Pt. on medications Normal cardiovascular exam+ dysrhythmias Atrial Fibrillation  Rhythm:Regular     Neuro/Psych negative neurological ROS  negative psych ROS   GI/Hepatic negative GI ROS, Neg liver ROS,,,  Endo/Other  negative endocrine ROSHypothyroidism    Renal/GU negative Renal ROS  negative genitourinary   Musculoskeletal   Abdominal Normal abdominal exam  (+)   Peds negative pediatric ROS (+)  Hematology negative hematology ROS (+)   Anesthesia Other Findings Past Medical History: No date: Bronchitis No date: Dysrhythmia No date: Fibrocystic breast disease No date: Goiter No date: Hyperlipidemia No date: Hypertension No date: Hypothyroidism No date: Urticaria No date: Vitamin D deficiency  Past Surgical History: No date: ABDOMINAL HYSTERECTOMY No date: APPENDECTOMY No date: BREAST CYST ASPIRATION; Left No date: COLONOSCOPY 01/24/2017: COLONOSCOPY WITH PROPOFOL ; N/A     Comment:  Procedure: COLONOSCOPY WITH PROPOFOL ;  Surgeon:               Gaylyn Gladis PENNER, MD;  Location: [facility] ENDOSCOPY;                Service: Endoscopy;  Laterality: N/A; No date: FRACTURE SURGERY     Comment:  left ankle No date: THYROIDECTOMY  BMI    Body Mass Index: 27.12 kg/m      Reproductive/Obstetrics negative OB ROS                              Anesthesia Physical Anesthesia Plan  ASA: 2  Anesthesia Plan: General   Post-op Pain  Management:    Induction: Intravenous  PONV Risk Score and Plan: Propofol  infusion and TIVA  Airway Management Planned: Natural Airway and Nasal Cannula  Additional Equipment:   Intra-op Plan:   Post-operative Plan:   Informed Consent: I have reviewed the patients History and Physical, chart, labs and discussed the procedure including the risks, benefits and alternatives for the proposed anesthesia with the patient or authorized representative who has indicated his/her understanding and acceptance.     Dental Advisory Given  Plan Discussed with: CRNA  Anesthesia Plan Comments:          Anesthesia Quick Evaluation

## 2024-01-15 NOTE — Progress Notes (Signed)
 eKG complete per ANesth. Order. Dr. Saintclair spoke with patient about EKG result and ok to discharge

## 2024-01-15 NOTE — Anesthesia Postprocedure Evaluation (Signed)
 Anesthesia Post Note  Patient: Melinda Hansen  Procedure(s) Performed: EGD (ESOPHAGOGASTRODUODENOSCOPY)  Patient location during evaluation: PACU Anesthesia Type: General Level of consciousness: awake and awake and alert Pain management: satisfactory to patient Vital Signs Assessment: post-procedure vital signs reviewed and stable Respiratory status: spontaneous breathing Cardiovascular status: stable Anesthetic complications: no   No notable events documented.   Last Vitals:  Vitals:   01/15/24 0926 01/15/24 0936  BP: 111/65 116/78  Pulse: 95 78  Resp: 19 15  Temp:    SpO2: 100% 98%    Last Pain:  Vitals:   01/15/24 0916  TempSrc:   PainSc: 0-No pain                 VAN STAVEREN,Zahriyah Joo
